# Patient Record
Sex: Female | Born: 1935 | Race: White | Hispanic: No | Marital: Married | Smoking: Never smoker
Health system: Southern US, Community
[De-identification: ages and names within clinical notes are randomized; demographics above are authoritative.]

## PROBLEM LIST (undated history)

## (undated) DIAGNOSIS — S32000A Wedge compression fracture of unspecified lumbar vertebra, initial encounter for closed fracture: Secondary | ICD-10-CM

## (undated) DIAGNOSIS — E039 Hypothyroidism, unspecified: Secondary | ICD-10-CM

## (undated) DIAGNOSIS — F03A0 Unspecified dementia, mild, without behavioral disturbance, psychotic disturbance, mood disturbance, and anxiety: Secondary | ICD-10-CM

## (undated) DIAGNOSIS — G4730 Sleep apnea, unspecified: Secondary | ICD-10-CM

## (undated) DIAGNOSIS — F0390 Unspecified dementia without behavioral disturbance: Secondary | ICD-10-CM

## (undated) DIAGNOSIS — I10 Essential (primary) hypertension: Secondary | ICD-10-CM

## (undated) DIAGNOSIS — I272 Pulmonary hypertension, unspecified: Secondary | ICD-10-CM

## (undated) HISTORY — DX: Hypothyroidism, unspecified: E03.9

## (undated) HISTORY — DX: Pulmonary hypertension, unspecified: I27.20

## (undated) HISTORY — PX: KYPHOSIS SURGERY: SHX114

## (undated) HISTORY — DX: Essential (primary) hypertension: I10

## (undated) HISTORY — DX: Sleep apnea, unspecified: G47.30

## (undated) HISTORY — PX: ANKLE FRACTURE SURGERY: SHX122

## (undated) HISTORY — DX: Wedge compression fracture of unspecified lumbar vertebra, initial encounter for closed fracture: S32.000A

---

## 2004-10-15 ENCOUNTER — Ambulatory Visit: Payer: Self-pay

## 2004-10-22 ENCOUNTER — Inpatient Hospital Stay: Payer: Self-pay | Admitting: Unknown Physician Specialty

## 2006-02-02 ENCOUNTER — Other Ambulatory Visit: Payer: Self-pay

## 2006-02-02 ENCOUNTER — Inpatient Hospital Stay: Payer: Self-pay | Admitting: Internal Medicine

## 2006-04-06 ENCOUNTER — Ambulatory Visit: Payer: Self-pay | Admitting: Gastroenterology

## 2006-05-18 ENCOUNTER — Ambulatory Visit: Payer: Self-pay | Admitting: Gastroenterology

## 2008-06-02 ENCOUNTER — Ambulatory Visit: Payer: Self-pay | Admitting: Internal Medicine

## 2008-06-15 ENCOUNTER — Ambulatory Visit: Payer: Self-pay | Admitting: Internal Medicine

## 2010-03-05 ENCOUNTER — Ambulatory Visit: Payer: Self-pay | Admitting: Cardiology

## 2010-03-12 ENCOUNTER — Ambulatory Visit: Payer: Self-pay | Admitting: Cardiology

## 2010-07-05 ENCOUNTER — Emergency Department: Payer: Self-pay | Admitting: Emergency Medicine

## 2010-10-21 ENCOUNTER — Ambulatory Visit: Payer: Self-pay

## 2010-10-23 ENCOUNTER — Inpatient Hospital Stay: Payer: Self-pay | Admitting: Unknown Physician Specialty

## 2010-10-27 ENCOUNTER — Encounter: Payer: Self-pay | Admitting: Internal Medicine

## 2010-11-05 ENCOUNTER — Ambulatory Visit: Payer: Self-pay | Admitting: Internal Medicine

## 2010-11-06 ENCOUNTER — Encounter: Payer: Self-pay | Admitting: Internal Medicine

## 2011-05-07 ENCOUNTER — Observation Stay: Payer: Self-pay | Admitting: Internal Medicine

## 2011-09-05 ENCOUNTER — Ambulatory Visit (INDEPENDENT_AMBULATORY_CARE_PROVIDER_SITE_OTHER): Payer: Medicare Other | Admitting: Critical Care Medicine

## 2011-09-05 ENCOUNTER — Encounter: Payer: Self-pay | Admitting: Critical Care Medicine

## 2011-09-05 DIAGNOSIS — S32009A Unspecified fracture of unspecified lumbar vertebra, initial encounter for closed fracture: Secondary | ICD-10-CM

## 2011-09-05 DIAGNOSIS — G4730 Sleep apnea, unspecified: Secondary | ICD-10-CM

## 2011-09-05 DIAGNOSIS — J449 Chronic obstructive pulmonary disease, unspecified: Secondary | ICD-10-CM

## 2011-09-05 DIAGNOSIS — M810 Age-related osteoporosis without current pathological fracture: Secondary | ICD-10-CM | POA: Insufficient documentation

## 2011-09-05 DIAGNOSIS — R0602 Shortness of breath: Secondary | ICD-10-CM

## 2011-09-05 DIAGNOSIS — I10 Essential (primary) hypertension: Secondary | ICD-10-CM | POA: Insufficient documentation

## 2011-09-05 DIAGNOSIS — E039 Hypothyroidism, unspecified: Secondary | ICD-10-CM

## 2011-09-05 DIAGNOSIS — S32000A Wedge compression fracture of unspecified lumbar vertebra, initial encounter for closed fracture: Secondary | ICD-10-CM

## 2011-09-05 MED ORDER — TIOTROPIUM BROMIDE MONOHYDRATE 18 MCG IN CAPS: 18.0000 ug | ORAL_CAPSULE | Freq: Every day | RESPIRATORY_TRACT | Status: DC

## 2011-09-05 MED ORDER — ALBUTEROL SULFATE (2.5 MG/3ML) 0.083% IN NEBU: 2.5000 mg | INHALATION_SOLUTION | Freq: Four times a day (QID) | RESPIRATORY_TRACT | Status: DC | PRN

## 2011-09-05 MED ORDER — LEVALBUTEROL HCL 0.63 MG/3ML IN NEBU: 1.0000 | INHALATION_SOLUTION | Freq: Four times a day (QID) | RESPIRATORY_TRACT | Status: DC | PRN

## 2011-09-05 NOTE — Patient Instructions (Addendum)
Stop Peforomist  on schedule, and  only use as needed  Albuterol 4 times daily for shortness of breath>>new script sent to pharmacy Start Spriva daily I will review records from [place] and review your CT Scan and call you Next OV will be in our [place] office with Dr Heber  in one month

## 2011-09-05 NOTE — Progress Notes (Signed)
Subjective:    Patient ID: Shelby George, female    DOB: June 21, 1916, 77 y.o.   MRN: [id number]  HPI Comments: Dx Copd 2011.   Had a pulmonary MD at [facility].   Shortness of Breath The current episode started more than 1 year ago. The problem occurs daily (exertional only, up to dining room). The problem has been gradually worsening. Pertinent negatives include no abdominal pain, chest pain, claudication, coryza, ear pain, fever, headaches, hemoptysis, leg pain, leg swelling, neck pain, orthopnea, PND, rash, rhinorrhea, sore throat, sputum production, swollen glands, syncope, vomiting or wheezing. Risk factors include smoking (worked in an office with smokers , other wise the pt is a lifelong never smoker). She has tried beta agonist inhalers for the symptoms. The treatment provided mild relief. Her past medical history is significant for COPD. There is no history of asthma, bronchiolitis, CAD, DVT, a heart failure, PE or pneumonia.    Past Medical History  Diagnosis Date  . Hypothyroidism   . Lumbar compression fracture   . Osteoporosis   . Pulmonary hypertension   . Systemic primary arterial hypertension   . Sleep apnea      Family History  Problem Relation Age of Onset  . Cerebral aneurysm Father      History   Social History  . Marital Status: Widowed    Spouse Name: N/A    Number of Children: 4  . Years of Education: N/A   Occupational History  . 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 Smoking status: Never Smoker   . Smokeless tobacco: Not on file  . Alcohol Use: No  . Drug Use: No  . Sexually Active: Not on file   Other Topics Concern  . Not on file   Social History Narrative  . No narrative on file     Allergies  Allergen Reactions  . Celebrex (Celecoxib)   . Fosamax      No outpatient prescriptions prior to visit.     Review of Systems  Constitutional: Negative for fever, chills, diaphoresis, activity  change, appetite change, fatigue and unexpected weight change.  HENT: Positive for hearing loss. Negative for ear pain, nosebleeds, congestion, sore throat, facial swelling, rhinorrhea, sneezing, mouth sores, trouble swallowing, neck pain, neck stiffness, dental problem, voice change, postnasal drip, sinus pressure, tinnitus and ear discharge.   Eyes: Negative for photophobia, discharge, itching and visual disturbance.  Respiratory: Positive for shortness of breath. Negative for apnea, cough, hemoptysis, sputum production, choking, chest tightness, wheezing and stridor.   Cardiovascular: Negative for chest pain, palpitations, orthopnea, claudication, leg swelling, syncope and PND.  Gastrointestinal: Negative for nausea, vomiting, abdominal pain, constipation, blood in stool and abdominal distention.  Genitourinary: Negative for dysuria, urgency, frequency, hematuria, flank pain, decreased urine volume and difficulty urinating.  Musculoskeletal: Positive for back pain. Negative for myalgias, joint swelling, arthralgias and gait problem.  Skin: Negative for color change, pallor and rash.  Neurological: Negative for dizziness, tremors, seizures, syncope, speech difficulty, weakness, light-headedness, numbness and headaches.  Hematological: Negative for adenopathy. Does not bruise/bleed easily.  Psychiatric/Behavioral: Negative for confusion, sleep disturbance and agitation. The patient is not nervous/anxious.        Objective:   Physical Exam  Filed Vitals:   09/05/11 0935  BP: 130/70  Pulse: 53  Temp: 98.1 F (36.7 C)  TempSrc: Oral  Height: 4\' 11"  (1.499 m)  Weight: 74.027 kg (163 lb 3.2 oz)  SpO2: 96%  Gen: Pleasant, well-nourished, in no distress,  normal affect  ENT: No lesions,  mouth clear,  oropharynx clear, no postnasal drip  Neck: No JVD, no TMG, no carotid bruits  Lungs: No use of accessory muscles, no dullness to percussion,distant BS  Cardiovascular: RRR, heart  sounds normal, no murmur or gallops, no peripheral edema  Abdomen: soft and NT, no HSM,  BS normal  Musculoskeletal: No deformities, no cyanosis or clubbing  Neuro: alert, non focal  Skin: Warm, no lesions or rashes  Cleda Daub 11/30: severe peripheral airflow obstruction CT Chest by report: emphysematous changes      Assessment & Plan:   COPD (chronic obstructive pulmonary disease) COPD d/t to passive smoke exposure. Ineffective treatment with perforomist Plan Trial Spiriva D/c perforomist Obtain outside recordes    Updated Medication List Outpatient Encounter Prescriptions as of 09/05/2011  Medication Sig Dispense Refill  . acetaminophen (TYLENOL) 325 MG tablet Take 650 mg by mouth every 6 (six) hours as needed.        Marland Kitchen amlodipine-benazepril (LOTREL) 2.5-10 MG per capsule Take 1 capsule by mouth daily.        . calcium gluconate 500 MG tablet Take 500 mg by mouth 2 (two) times daily.        . isosorbide mononitrate (IMDUR) 60 MG 24 hr tablet Take 90 mg by mouth daily.        Marland Kitchen levothyroxine (SYNTHROID, LEVOTHROID) 112 MCG tablet Take 112 mcg by mouth daily.        . metoprolol succinate (TOPROL-XL) 25 MG 24 hr tablet Take 25 mg by mouth daily.        Marland Kitchen DISCONTD: formoterol (PERFOROMIST) 20 MCG/2ML nebulizer solution Take 20 mcg by nebulization 2 (two) times daily.        Marland Kitchen DISCONTD: levalbuterol (XOPENEX) 0.63 MG/3ML nebulizer solution Take 1 ampule by nebulization 2 (two) times daily.       Marland Kitchen DISCONTD: levalbuterol (XOPENEX) 0.63 MG/3ML nebulizer solution Take 3 mLs (0.63 mg total) by nebulization every 6 (six) hours as needed for wheezing.  3 mL    . albuterol (PROVENTIL) (2.5 MG/3ML) 0.083% nebulizer solution Take 3 mLs (2.5 mg total) by nebulization every 6 (six) hours as needed for wheezing or shortness of breath.  120 mL  6  . tiotropium (SPIRIVA HANDIHALER) 18 MCG inhalation capsule Place 1 capsule (18 mcg total) into inhaler and inhale daily.  30 capsule  6  . DISCONTD:  aspirin 81 MG tablet Take 81 mg by mouth daily.

## 2011-09-07 DIAGNOSIS — J449 Chronic obstructive pulmonary disease, unspecified: Secondary | ICD-10-CM | POA: Insufficient documentation

## 2011-09-07 NOTE — Assessment & Plan Note (Signed)
COPD d/t to passive smoke exposure. Ineffective treatment with perforomist Plan Trial Spiriva D/c perforomist Obtain outside recordes

## 2011-09-28 ENCOUNTER — Emergency Department: Payer: Self-pay | Admitting: Unknown Physician Specialty

## 2011-10-17 ENCOUNTER — Ambulatory Visit: Payer: Medicare Other | Admitting: Internal Medicine

## 2011-11-28 ENCOUNTER — Telehealth: Payer: Self-pay | Admitting: Critical Care Medicine

## 2011-11-28 NOTE — Telephone Encounter (Signed)
Per PW last note pt was to have a 1 month follow up w/ [facility] in [place]. I have scheduled this for 12/11/11 at 3:00. Mrs. Shelby George is aware of this and the address. Nothing further was needed

## 2011-12-11 ENCOUNTER — Ambulatory Visit (INDEPENDENT_AMBULATORY_CARE_PROVIDER_SITE_OTHER): Payer: Medicare Other | Admitting: Pulmonary Disease

## 2011-12-11 DIAGNOSIS — J449 Chronic obstructive pulmonary disease, unspecified: Secondary | ICD-10-CM

## 2011-12-11 NOTE — Progress Notes (Deleted)
  Subjective:    Patient ID: Shelby George, female    DOB: 25-Jan-1916, 78 y.o.   MRN: [id number]  Synopsis: Ms. Knudtson was first seen in the [facility]monary office by Dr. Delford Field on 09/05/2011 for shortness of breath.  She was found to have obstruction which, in the setting of significant passive smoke exposure, was interpreted as COPD.  HPI  Past Medical History  Diagnosis Date  . Hypothyroidism   . Lumbar compression fracture   . Osteoporosis   . Pulmonary hypertension   . Systemic primary arterial hypertension   . Sleep apnea      Family History  Problem Relation Age of Onset  . Cerebral aneurysm Father      History   Social History  . Marital Status: Widowed    Spouse Name: N/A    Number of Children: 4  . Years of Education: N/A   Occupational History  . 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 Smoking status: Never Smoker   . Smokeless tobacco: Not on file  . Alcohol Use: No  . Drug Use: No  . Sexually Active: Not on file   Other Topics Concern  . Not on file   Social History Narrative  . No narrative on file     Allergies  Allergen Reactions  . Celebrex (Celecoxib)   . Fosamax      Outpatient Prescriptions Prior to Visit  Medication Sig Dispense Refill  . acetaminophen (TYLENOL) 325 MG tablet Take 650 mg by mouth every 6 (six) hours as needed.        Marland Kitchen albuterol (PROVENTIL) (2.5 MG/3ML) 0.083% nebulizer solution Take 3 mLs (2.5 mg total) by nebulization every 6 (six) hours as needed for wheezing or shortness of breath.  120 mL  6  . amlodipine-benazepril (LOTREL) 2.5-10 MG per capsule Take 1 capsule by mouth daily.        . calcium gluconate 500 MG tablet Take 500 mg by mouth 2 (two) times daily.        . isosorbide mononitrate (IMDUR) 60 MG 24 hr tablet Take 90 mg by mouth daily.        Marland Kitchen levothyroxine (SYNTHROID, LEVOTHROID) 112 MCG tablet Take 112 mcg by mouth daily.        . metoprolol succinate (TOPROL-XL)  25 MG 24 hr tablet Take 25 mg by mouth daily.        Marland Kitchen tiotropium (SPIRIVA HANDIHALER) 18 MCG inhalation capsule Place 1 capsule (18 mcg total) into inhaler and inhale daily.  30 capsule  6      Review of Systems     Objective:   Physical Exam   09/05/11 Simple Spirometry: Ratio 66%, FEV1 0.77 L (65% pred)  05/2011 CT chest with contrast at [facility]: no PE, linear scarring in bases; s/p vertebroplasty, breast mass  01/2011 2D TTE: LVEF 60-65%, normal LV function, normal RV size and function, trivial AR, MR, TR    Assessment & Plan:

## 2011-12-23 ENCOUNTER — Encounter: Payer: Self-pay | Admitting: Pulmonary Disease

## 2011-12-23 ENCOUNTER — Ambulatory Visit (INDEPENDENT_AMBULATORY_CARE_PROVIDER_SITE_OTHER): Payer: Medicare Other | Admitting: Pulmonary Disease

## 2011-12-23 VITALS — BP 118/60 | HR 52 | Temp 97.6°F | Ht 60.0 in | Wt 159.0 lb

## 2011-12-23 DIAGNOSIS — J449 Chronic obstructive pulmonary disease, unspecified: Secondary | ICD-10-CM

## 2011-12-23 NOTE — Patient Instructions (Signed)
Continue using the albuterol as needed. Use Spiriva every day. Join an exercise group at your facility and try to exercise 20-30 minutes a day. We will see you back in 3 months.

## 2011-12-23 NOTE — Progress Notes (Signed)
Subjective:    Patient ID: Shelby George, female    DOB: July 08, 1916, 78 y.o.   MRN: [id number]  Synopsis: Shelby George established care with the [facility] pulmonary clinic in 11/2011 with Dr. Delford Field for evaluation of shortness of breath.  He diagnosed her with COPD and placed her on spiriva and albuterol.  HPI  12/23/11 ROV -- Shelby George returns for follow up after her clinic visit with Dr. Delford Field.  She states that she is still breathless with minimal exertion such as walking from her room at the assisted living facility to the dining hall.  She denies chest pain or leg swelling aside from chronic right ankle swelling from a prior injury.  She states that when she uses albuterol it helps a lot, and she uses it 2-3 times per day.  She denies wheezing or chest tightness.  She does not exercise regularly. She only uses the Spiriva sporadically but states that it helps when she takes it.  Of note, initially in the interview she noted that it was difficult to use so she stopped, but then later she said that she felt it was easy to use.  Past Medical History  Diagnosis Date  . Hypothyroidism   . Lumbar compression fracture   . Osteoporosis   . Pulmonary hypertension   . Systemic primary arterial hypertension   . Sleep apnea      Family History  Problem Relation Age of Onset  . Cerebral aneurysm Father      History   Social History  . Marital Status: Widowed    Spouse Name: N/A    Number of Children: 4  . Years of Education: N/A   Occupational History  . 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 Smoking status: Never Smoker   . Smokeless tobacco: Never Used  . Alcohol Use: No  . Drug Use: No  . Sexually Active: Not on file   Other Topics Concern  . Not on file   Social History Narrative  . No narrative on file     Allergies  Allergen Reactions  . Celebrex (Celecoxib)   . Fosamax      Outpatient Prescriptions Prior to Visit  Medication Sig  Dispense Refill  . albuterol (PROVENTIL) (2.5 MG/3ML) 0.083% nebulizer solution Take 3 mLs (2.5 mg total) by nebulization every 6 (six) hours as needed for wheezing or shortness of breath.  120 mL  6  . amlodipine-benazepril (LOTREL) 2.5-10 MG per capsule Take 1 capsule by mouth daily.        . isosorbide mononitrate (IMDUR) 60 MG 24 hr tablet Take 90 mg by mouth daily.        Marland Kitchen levothyroxine (SYNTHROID, LEVOTHROID) 112 MCG tablet Take 112 mcg by mouth daily.        . metoprolol succinate (TOPROL-XL) 25 MG 24 hr tablet Take 25 mg by mouth daily.        Marland Kitchen acetaminophen (TYLENOL) 325 MG tablet Take 650 mg by mouth every 6 (six) hours as needed.        . calcium gluconate 500 MG tablet Take 500 mg by mouth 2 (two) times daily.        Marland Kitchen tiotropium (SPIRIVA HANDIHALER) 18 MCG inhalation capsule Place 1 capsule (18 mcg total) into inhaler and inhale daily.  30 capsule  6      Review of Systems  Constitutional: Negative for fever, chills and unexpected weight change.  HENT: Negative for  ear pain, nosebleeds, congestion, sore throat, rhinorrhea, sneezing, trouble swallowing, dental problem, voice change, postnasal drip and sinus pressure.   Eyes: Negative for visual disturbance.  Respiratory: Positive for shortness of breath. Negative for cough and choking.   Cardiovascular: Negative for chest pain and leg swelling.  Gastrointestinal: Negative for vomiting, abdominal pain and diarrhea.  Genitourinary: Negative for difficulty urinating.  Musculoskeletal: Negative for arthralgias.  Skin: Negative for rash.  Neurological: Negative for tremors, syncope and headaches.  Hematological: Does not bruise/bleed easily.       Objective:   Physical Exam  Filed Vitals:   12/23/11 1532  BP: 118/60  Pulse: 52  Temp: 97.6 F (36.4 C)  TempSrc: Oral  Height: 5' (1.524 m)  Weight: 159 lb (72.122 kg)  SpO2: 93%   Gen: well appearing, no acute distress HEENT: NCAT, PERRL, EOMi, OP clear, neck supple  without masses PULM: Few insp crackles in R base, otherwise clear CV: RRR, there is a systolic murmur in the RUSB, no JVD AB: BS+, soft, nontender, no hsm Ext: warm, trace R ankle edema, no edema in left foot, no clubbing, no cyanosis Derm: no rash or skin breakdown Neuro: A&Ox4, CN II-XII intact, strength 5/5 in all 4 extremities  09/05/11 Simple spirometry: FEV1/FVC 65% FEV1 0.77L  (65% pred)    Assessment & Plan:   COPD (chronic obstructive pulmonary disease) I explained to Shelby George that her breathlessness is likely due to COPD given her passive smoke exposure in the past.  However, it is normal for adults her age to have some degree of obstruction, so I think it is fair to say that severe lung obstruction is not the sole reason for her breathlessness.  She says that the Spiriva helps, but strangely she is not using it daily.    She is also likely quite deconditioned which also contributes to her dyspnea.  We discussed both aggressive and more nihilistic approaches to her symptoms and decided on the following.  Plan: -Use spiriva daily -join the exercise club ("silver sneakers") at the gym at her ALF with plans to exercise 20-30 minutes daily -continue taking the albuterol as written    Updated Medication List Outpatient Encounter Prescriptions as of 12/23/2011  Medication Sig Dispense Refill  . albuterol (PROVENTIL) (2.5 MG/3ML) 0.083% nebulizer solution Take 3 mLs (2.5 mg total) by nebulization every 6 (six) hours as needed for wheezing or shortness of breath.  120 mL  6  . amlodipine-benazepril (LOTREL) 2.5-10 MG per capsule Take 1 capsule by mouth daily.        . Cholecalciferol (VITAMIN D) 2000 UNITS CAPS Take 1 capsule by mouth daily.      . isosorbide mononitrate (IMDUR) 60 MG 24 hr tablet Take 90 mg by mouth daily.        Marland Kitchen levothyroxine (SYNTHROID, LEVOTHROID) 112 MCG tablet Take 112 mcg by mouth daily.        . metoprolol succinate (TOPROL-XL) 25 MG 24 hr tablet Take 25  mg by mouth daily.        Marland Kitchen tiotropium (SPIRIVA) 18 MCG inhalation capsule Place 18 mcg into inhaler and inhale daily. As needed per pt      . DISCONTD: acetaminophen (TYLENOL) 325 MG tablet Take 650 mg by mouth every 6 (six) hours as needed.        Marland Kitchen DISCONTD: calcium gluconate 500 MG tablet Take 500 mg by mouth 2 (two) times daily.        Marland Kitchen DISCONTD: tiotropium (SPIRIVA  HANDIHALER) 18 MCG inhalation capsule Place 1 capsule (18 mcg total) into inhaler and inhale daily.  30 capsule  6

## 2011-12-23 NOTE — Assessment & Plan Note (Signed)
I explained to Shelby George that her breathlessness is likely due to COPD given her passive smoke exposure in the past.  However, it is normal for adults her age to have some degree of obstruction, so I think it is fair to say that severe lung obstruction is not the sole reason for her breathlessness.  She says that the Spiriva helps, but strangely she is not using it daily.    She is also likely quite deconditioned which also contributes to her dyspnea.  We discussed both aggressive and more nihilistic approaches to her symptoms and decided on the following.  Plan: -Use spiriva daily -join the exercise club ("silver sneakers") at the gym at her ALF with plans to exercise 20-30 minutes daily -continue taking the albuterol as written

## 2012-01-21 NOTE — Progress Notes (Signed)
No show

## 2012-05-11 ENCOUNTER — Telehealth: Payer: Self-pay | Admitting: Pulmonary Disease

## 2012-05-11 ENCOUNTER — Encounter: Payer: Self-pay | Admitting: Pulmonary Disease

## 2012-05-11 ENCOUNTER — Ambulatory Visit (INDEPENDENT_AMBULATORY_CARE_PROVIDER_SITE_OTHER): Payer: Medicare Other | Admitting: Pulmonary Disease

## 2012-05-11 VITALS — BP 94/62 | HR 65 | Temp 98.2°F | Ht 60.0 in | Wt 157.0 lb

## 2012-05-11 DIAGNOSIS — J449 Chronic obstructive pulmonary disease, unspecified: Secondary | ICD-10-CM

## 2012-05-11 NOTE — Progress Notes (Signed)
Subjective:    Patient ID: Shelby George, female    DOB: 11-26-1915, 78 y.o.   MRN: [id number]  Synopsis: Shelby George established care with the [facility] pulmonary clinic in 11/2011 with Dr. Delford Field for evaluation of shortness of breath.  He diagnosed her with COPD and placed her on spiriva and albuterol.  HPI   12/23/11 ROV -- Shelby George returns for follow up after her clinic visit with Dr. Delford Field.  She states that she is still breathless with minimal exertion such as walking from her room at the assisted living facility to the dining hall.  She denies chest pain or leg swelling aside from chronic right ankle swelling from a prior injury.  She states that when she uses albuterol it helps a lot, and she uses it 2-3 times per day.  She denies wheezing or chest tightness.  She does not exercise regularly. She only uses the Spiriva sporadically but states that it helps when she takes it.  Of note, initially in the interview she noted that it was difficult to use so she stopped, but then later she said that she felt it was easy to use.  05/11/12 ROV --  Shelby George returns today for followup. It's pretty clear that she has not been using her Spiriva as directed. She thinks that she's supposed to be using it on an as-needed basis and is frustrated that it does not provide immediate relief and her shortness of breath. I had a very difficult time trying to ascertain as to whether or not she she is on daily basis consistently for the last month. Given the fact that she's only refilled the inhaler once since March I think it's unlikely. She states that she continues to get immediate benefit from using albuterol on a nearly every 6 hour basis. She reports no cough no chest pain no leg swelling. She is not participating in the Silver sneakers program at her assisted living facility because she doesn't want to.   Past Medical History  Diagnosis Date  . Hypothyroidism   . Lumbar compression fracture   . Osteoporosis    . Pulmonary hypertension   . Systemic primary arterial hypertension   . Sleep apnea        Review of Systems  Constitutional: Negative for fever, chills and unexpected weight change.  HENT: Negative for nosebleeds, congestion, rhinorrhea and postnasal drip.   Eyes: Negative for visual disturbance.  Respiratory: Positive for shortness of breath. Negative for cough and choking.   Cardiovascular: Negative for chest pain and leg swelling.       Objective:   Physical Exam   Filed Vitals:   05/11/12 1438  BP: 94/62  Pulse: 65  Temp: 98.2 F (36.8 C)  TempSrc: Oral  Height: 5' (1.524 m)  Weight: 157 lb (71.215 kg)  SpO2: 97%   Gen: well appearing, no acute distress HEENT: NCAT, PERRL, EOMi, OP clear, neck supple without masses PULM: Few basilar crackles, otherwise CTA B CV: RRR, there is a systolic murmur in the RUSB, no JVD AB: BS+, soft, nontender, no hsm Ext: warm, trace R ankle edema, no edema in left foot, no clubbing, no cyanosis   09/05/11 Simple spirometry: FEV1/FVC 65% FEV1 0.77L  (65% pred)    Assessment & Plan:   COPD (chronic obstructive pulmonary disease) This is been a stable interval for Shelby George by her lack of progression of symptoms. However it is difficult to assess how well she is responding to therapy as she clearly  has not been using the Spiriva effectively. We spent the majority of the visit today talking about appropriate use of Spiriva and how it is unlikely that she will see an immediate benefit after using the medication.  Plan: -I. advised her to start using the Spiriva at 8:30 daily when she takes her other daily medications. - She was advised that she's not used Spriva as needed -Asked her to continue using albuterol regularly as she clearly benefits from this -If after one month of continuous Spriva use she has not felt improvement in her breathing then I will change her to duo nebs rather than albuterol to be used on a scheduled basis  throughout the day.    Updated Medication List Outpatient Encounter Prescriptions as of 05/11/2012  Medication Sig Dispense Refill  . albuterol (PROVENTIL) (2.5 MG/3ML) 0.083% nebulizer solution Take 3 mLs (2.5 mg total) by nebulization every 6 (six) hours as needed for wheezing or shortness of breath.  120 mL  6  . amlodipine-benazepril (LOTREL) 2.5-10 MG per capsule Take 1 capsule by mouth daily.        . Cholecalciferol (VITAMIN D) 2000 UNITS CAPS Take 1 capsule by mouth daily.      . isosorbide mononitrate (IMDUR) 60 MG 24 hr tablet Take 90 mg by mouth daily.        Marland Kitchen levothyroxine (SYNTHROID, LEVOTHROID) 112 MCG tablet Take 112 mcg by mouth daily.        . metoprolol succinate (TOPROL-XL) 25 MG 24 hr tablet Take 25 mg by mouth daily.        Marland Kitchen tiotropium (SPIRIVA) 18 MCG inhalation capsule Place 18 mcg into inhaler and inhale daily. As needed per pt

## 2012-05-11 NOTE — Patient Instructions (Signed)
Take spiriva every day at 8:30 every day no matter how you feel. Only take Spiriva once per day. Keep taking the albuterol as you are doing. If you are not breathing any better in one month, call us so we can switch your albuterol to Duoneb. We will see you back in 2-3 months.

## 2012-05-11 NOTE — Assessment & Plan Note (Addendum)
This is been a stable interval for Shelby George by her lack of progression of symptoms. However it is difficult to assess how well she is responding to therapy as she clearly has not been using the Spiriva effectively. We spent the majority of the visit today talking about appropriate use of Spiriva and how it is unlikely that she will see an immediate benefit after using the medication.  Plan: -I. advised her to start using the Spiriva at 8:30 daily when she takes her other daily medications. - She was advised that she's not used Spriva as needed -Asked her to continue using albuterol regularly as she clearly benefits from this -If after one month of continuous Spriva use she has not felt improvement in her breathing then I will change her to duo nebs rather than albuterol to be used on a scheduled basis throughout the day.

## 2012-05-11 NOTE — Telephone Encounter (Signed)
This was not on pt medlist. I have added this to med list--lmomtcb x1 to make aware of this

## 2012-05-12 NOTE — Telephone Encounter (Signed)
Daughter aware.  Nothing further needed.  [facility]

## 2012-05-12 NOTE — Telephone Encounter (Signed)
lmomtcb x 2  

## 2012-06-09 ENCOUNTER — Telehealth: Payer: Self-pay | Admitting: Pulmonary Disease

## 2012-06-09 NOTE — Telephone Encounter (Signed)
Called, spoke with pt's daughter.  She was last seen on 05/11/12:  Patient Instructions     Take spiriva every day at 8:30 every day no matter how you feel.  Only take Spiriva once per day.  Keep taking the albuterol as you are doing.  If you are not breathing any better in one month, call us so we can switch your albuterol to Duoneb.  We will see you back in 2-3 months.    ----  Reports pt is using spiriva everyday.  She is no better - daughter reports she is having a few more episodes of SOB since last visit.  She is using albuterol neb approx twice daily.  Has o2 to use qhs -- pt has even used this a few times during the day as she felt she needed more o2 and had no relief from neb.  Requesting further recs.  Dr. Kendrick Fries, pls advise.  Thank you.

## 2012-06-10 MED ORDER — IPRATROPIUM-ALBUTEROL 0.5-2.5 (3) MG/3ML IN SOLN: RESPIRATORY_TRACT | Status: DC

## 2012-06-10 NOTE — Telephone Encounter (Signed)
D/C spiriva and albuterol Start duoneb qid Thanks

## 2012-06-10 NOTE — Telephone Encounter (Signed)
I spoke with daughter and is aware pt is to d/c spiriva and albuterol. Will start her on duoneb QID. She asked this be sent to [facility] pharmacy. I have done so. Nothing further was needed

## 2012-06-15 ENCOUNTER — Telehealth: Payer: Self-pay | Admitting: Pulmonary Disease

## 2012-06-15 NOTE — Telephone Encounter (Signed)
Daughter calling - Short of breath - just changed to duonebs. Having difficulty remembering to take it 4 /day. Took an extra one & felt better

## 2012-06-15 NOTE — Telephone Encounter (Signed)
noted 

## 2012-11-22 ENCOUNTER — Telehealth: Payer: Self-pay | Admitting: Pulmonary Disease

## 2012-11-22 NOTE — Telephone Encounter (Signed)
?   Has she ever had Alpha 1 testing that we don't know that about? LMTCB for Fannie Knee

## 2012-11-23 ENCOUNTER — Encounter: Payer: Self-pay | Admitting: Pulmonary Disease

## 2012-11-23 ENCOUNTER — Ambulatory Visit (INDEPENDENT_AMBULATORY_CARE_PROVIDER_SITE_OTHER): Payer: Medicare Other | Admitting: Pulmonary Disease

## 2012-11-23 VITALS — BP 124/82 | HR 56 | Ht 60.0 in | Wt 157.0 lb

## 2012-11-23 DIAGNOSIS — J449 Chronic obstructive pulmonary disease, unspecified: Secondary | ICD-10-CM

## 2012-11-23 MED ORDER — IPRATROPIUM-ALBUTEROL 0.5-2.5 (3) MG/3ML IN SOLN: RESPIRATORY_TRACT | Status: DC

## 2012-11-23 NOTE — Progress Notes (Signed)
Subjective:    Patient ID: Shelby George, female    DOB: 08/25/1916, 79 y.o.   MRN: [id number]  Synopsis: Shelby George established care with the [facility] pulmonary clinic in 11/2011 with Dr. Delford Field for evaluation of shortness of breath.  He diagnosed her with COPD and placed her on spiriva and albuterol.  HPI   12/23/11 ROV -- Shelby George returns for follow up after her clinic visit with Dr. Delford Field.  She states that she is still breathless with minimal exertion such as walking from her room at the assisted living facility to the dining hall.  She denies chest pain or leg swelling aside from chronic right ankle swelling from a prior injury.  She states that when she uses albuterol it helps a lot, and she uses it 2-3 times per day.  She denies wheezing or chest tightness.  She does not exercise regularly. She only uses the Spiriva sporadically but states that it helps when she takes it.  Of note, initially in the interview she noted that it was difficult to use so she stopped, but then later she said that she felt it was easy to use.  05/11/12 ROV --  Shelby George returns today for followup. It's pretty clear that she has not been using her Spiriva as directed. She thinks that she's supposed to be using it on an as-needed basis and is frustrated that it does not provide immediate relief and her shortness of breath. I had a very difficult time trying to ascertain as to whether or not she she is on daily basis consistently for the last month. Given the fact that she's only refilled the inhaler once since March I think it's unlikely. She states that she continues to get immediate benefit from using albuterol on a nearly every 6 hour basis. She reports no cough no chest pain no leg swelling. She is not participating in the Silver sneakers program at her assisted living facility because she doesn't want to.   11/23/2012 ROV -- Shelby George is doing well and feels like she has been breathing well.  She stopped using  the spiriva and has been using duoneb tid to qid.  She feels that she can get around without difficulty.  She reports no cough but apparently has been clearing her throat a bit.  No sinus drainage or acid reflux.      Past Medical History  Diagnosis Date  . Hypothyroidism   . Lumbar compression fracture   . Osteoporosis   . Pulmonary hypertension   . Systemic primary arterial hypertension   . Sleep apnea        Review of Systems  Constitutional: Negative for fever, chills and unexpected weight change.  HENT: Negative for nosebleeds, congestion, rhinorrhea and postnasal drip.   Eyes: Negative for visual disturbance.  Respiratory: Negative for cough, choking and shortness of breath.   Cardiovascular: Negative for chest pain and leg swelling.       Objective:   Physical Exam   Filed Vitals:   11/23/12 1215  BP: 124/82  Pulse: 56  Height: 5' (1.524 m)  Weight: 157 lb (71.215 kg)  SpO2: 97%   Gen: well appearing, no acute distress HEENT: NCAT, EOMi, OP clear,  PULM: CTA B CV: RRR, there is a systolic murmur in the RUSB, no JVD AB: soft, nontender, no hsm Ext: warm, trace R ankle edema, no edema in left foot, no clubbing, no cyanosis   09/05/11 Simple spirometry: FEV1/FVC 65% FEV1 0.77L  (65%  pred)    Assessment & Plan:   COPD (chronic obstructive pulmonary disease) This has been a stable interval for Shelby George and she seems to be doing well overall.  She continues to use and benefit from the duoneb two-three times a day.    Plan: -continue duoneb tid to qid as she is doing    Updated Medication List Outpatient Encounter Prescriptions as of 11/23/2012  Medication Sig Dispense Refill  . albuterol (PROVENTIL) (2.5 MG/3ML) 0.083% nebulizer solution Take 3 mLs (2.5 mg total) by nebulization every 6 (six) hours as needed for wheezing or shortness of breath.  120 mL  6  . amlodipine-benazepril (LOTREL) 2.5-10 MG per capsule Take 1 capsule by mouth daily.        .  Cholecalciferol (VITAMIN D) 2000 UNITS CAPS Take 1 capsule by mouth daily.      Marland Kitchen donepezil (ARICEPT) 5 MG tablet Take 5 mg by mouth daily.      Marland Kitchen ipratropium-albuterol (DUONEB) 0.5-2.5 (3) MG/3ML SOLN 1 vial 4 times a day. Dx: 496  360 mL  3  . isosorbide mononitrate (IMDUR) 60 MG 24 hr tablet Take 90 mg by mouth daily.        Marland Kitchen levothyroxine (SYNTHROID, LEVOTHROID) 112 MCG tablet Take 112 mcg by mouth daily.        . metoprolol succinate (TOPROL-XL) 25 MG 24 hr tablet Take 25 mg by mouth daily.        Marland Kitchen tiotropium (SPIRIVA) 18 MCG inhalation capsule Place 18 mcg into inhaler and inhale daily. As needed per pt       No facility-administered encounter medications on file as of 11/23/2012.

## 2012-11-23 NOTE — Patient Instructions (Signed)
If you have more throat clearing problems and you have sinus congestion: -use chlortrimeton and an over the counter decongestant like pseudophed and phenylephrine -if you have heart burn with this try an over the counter decongestant  Use the nebulizer at home as you are doing  We will see you back in 6 months

## 2012-11-23 NOTE — Telephone Encounter (Signed)
Pt states that she doesn't need a call back. She is in the office and will speak to Dr. Kendrick Fries about her concerns.

## 2012-11-23 NOTE — Telephone Encounter (Signed)
lmomtcb for sue

## 2012-11-23 NOTE — Assessment & Plan Note (Signed)
This has been a stable interval for Shelby George and she seems to be doing well overall.  She continues to use and benefit from the duoneb two-three times a day.    Plan: -continue duoneb tid to qid as she is doing

## 2012-11-30 ENCOUNTER — Ambulatory Visit: Payer: Medicare Other | Admitting: Pulmonary Disease

## 2013-01-23 ENCOUNTER — Telehealth: Payer: Self-pay | Admitting: Internal Medicine

## 2013-01-23 NOTE — Telephone Encounter (Signed)
Patient just returned from [place] back to [facility] She forgot and left the electrical charge of the CPAP machine behind. Daughter is reporting chronic stable respiratory symptoms. However, patient and daughter are extremely worried that she will have a respiratory decompensation without CPAP tonight the 20th April 2014 Sunday at 8 PM where all establishments to get an Passenger transport manager are closed. They tried calling the home care company but there is no on-call and was referred to 911.  Chart review shows that no pulmonary or pulmonologist as documented patient uses CPAP machine at night so I am presuming this was sleep apnea prescribed by a sleep specialist in [place] several years ago  PLAN  - Advised daughter to stay with patient overnight  - Advised daughter to try [employer] for Passenger transport manager  - In the absence of an Passenger transport manager.adaptor monitor clinically at the bedside and if that is decompensation go to the emergency room. Explained that the likelihood of this happening is low  - On 01/24/2013 Monday daughter is to call home care company  - Sending this message to triage so that they can followup -   Dr. Kalman Shan, M.D., [facility].C.P Pulmonary and Critical Care Medicine Staff Physician [facility] Pager: [phone], If no answer or between  15:00h - 7:00h: call [phone]  01/23/2013 8:08 PM

## 2013-01-24 NOTE — Telephone Encounter (Signed)
Called and spoke with Shelby George She states that she borrowed a Consulting civil engineer from a friend who uses CPAP machine She will call the DME today and get a new one She states that the pt is doing fine and nothing needed \

## 2013-12-06 ENCOUNTER — Ambulatory Visit: Payer: Medicare PPO | Admitting: Pulmonary Disease

## 2014-11-20 ENCOUNTER — Inpatient Hospital Stay: Payer: Self-pay | Admitting: Internal Medicine

## 2015-02-04 NOTE — Consult Note (Signed)
PATIENT NAME:  Shelby George, [name]:  [id number] DATE OF BIRTH:  19-Dec-1915  DATE OF CONSULTATION:  11/20/2014  REFERRING PHYSICIAN:   CONSULTING PHYSICIAN:  Kathreen DevoidKevin L. Caretha Rumbaugh, MD  REASON FOR CONSULTATION: Left intertrochanteric hip fracture.   HISTORY OF PRESENT ILLNESS: Ms. Shelby George is a 81 year old female, who sustained a mechanical fall at her nursing facility earlier this morning. She was brought to [facility] where she was diagnosed with a displaced left intertrochanteric hip fracture by x-ray. She was admitted to the hospitalist service for preoperative clearance, and orthopedics was consulted for management of her hip fracture.   At the bedside today, the patient is a pleasant female in no acute distress. She, however, does not recall the mechanism of the fall or the situation surrounding her fall.   PAST MEDICAL HISTORY: Includes osteoporosis, coronary artery disease, hypertension, hypothyroidism, shortness of breath.  PAST SURGICAL HISTORY: Includes a previous kyphoplasty and right ankle surgery.   HOME MEDICATIONS: Symbicort 80 mcg/4.5 mcg aerosol 1 puff inhaled q.12 h., metoprolol succinate 25 mg 1 tablet daily, Levoxyl 112 mcg daily, Imdur 60 mg 1-1/2 tablets p.o. daily, amlodipine/benazepril 2.5/10 mg tablet once daily.   ALLERGIES: No known drug allergies.   SOCIAL HISTORY: The patient lives in a nursing home. She is ambulatory at baseline with a walker according to her daughter.   PHYSICAL EXAMINATION: LEFT HIP: The patient's skin is intact. There is no erythema, ecchymosis or swelling. Her thigh and leg compartments are soft and compressible. She has intact sensation to light touch and palpable pedal pulses. She can flex and extend her toes and dorsiflex and plantarflex her ankle. Her left lower extremity is shortened and externally rotated.   RADIOLOGY: X-ray films of the left hip reveal a displaced intertrochanteric hip fracture. The patient  has degenerative changes of the lumbar spine. No other fractures are noted.   ASSESSMENT: Left displaced intertrochanteric hip fracture.   PLAN: The patient is ambulatory at baseline with a walker. I have spoken with her daughter on the phone and I have recommended an intramedullary fixation for her left hip. The daughter understands the risks of surgery include infection, bleeding requiring blood transfusion, nerve or blood vessel injury, malunion, nonunion, change in lower extremity rotation or leg length discrepancy of the left lower extremity. She understands that the patient may have persistent pain, she may fail to return to her ambulatory baseline status and may require further surgery. Medical risks include, but are not limited to DVT and pulmonary embolism, myocardial infarction, stroke, pneumonia, respiratory failure and death. The patient's daughter understood these risks and agreed with the plan for surgery. Phone consent was taken from the daughter. This was witnessed by Alcario DroughtErica and [place], nurses on 1A. The patient is n.p.o. I have reviewed all her laboratory work in preparation for this surgery. She was cleared for surgery by the hospitalist service, but is deemed a moderate risk for this non-thoracic surgery.     ____________________________ Kathreen DevoidKevin L. Jaun Galluzzo, MD klk:JT D: 11/20/2014 09:04:19 ET T: 11/20/2014 09:24:19 ET JOB#: [id number]  cc: Kathreen DevoidKevin L. Haniyah Maciolek, MD, <Dictator> Kathreen DevoidKEVIN L Cherrise Occhipinti MD ELECTRONICALLY SIGNED 11/29/2014 14:45

## 2015-02-04 NOTE — H&P (Signed)
PATIENT NAME:  Shelby George, Shelby George MR#:  [id number] DATE OF BIRTH:  11/27/1915  DATE OF ADMISSION:  11/20/2014  REFERRING PHYSICIAN: [place] SinkJade J. Dolores FrameSung, M.D.   PRIMARY CARE PHYSICIAN: Danella PentonMark F. Miller, M.D.    ADMIT DIAGNOSIS: Left intertrochanteric fracture.   HISTORY OF PRESENT ILLNESS: This is a 81 year old, Caucasian female, who presents to the Emergency Department with a painful left hip. X-ray of the extremity showed an intertrochanteric fracture of the left femur. The patient states that she thinks she fell as she was going to the bathroom, but cannot recall how long she may have been on the floor. She lives at a retirement community. She had definitely received pain medications at the time of our interview, which may contribute to some of her memory loss, but she may also have some element of dementia. Aside from her hip, the patient denies any pain or shortness of breath. Orthopedic surgery was consulted, and the Emergency Department staff called the internal medicine service for admission.   REVIEW OF SYSTEMS:   CONSTITUTIONAL: The patient denies fevers or recent illness.  EYES: Denies blurred vision or inflammation.  ENT: Denies tinnitus or sore throat.  RESPIRATORY: Denies cough or shortness of breath.  CARDIOVASCULAR: Denies chest pain or orthopnea.  GASTROINTESTINAL: Denies nausea, vomiting, diarrhea, or abdominal pain.   GENITOURINARY: Denies dysuria, increased frequency, or hesitancy of urination.  ENDOCRINE: Denies polyuria or polydipsia.  INTEGUMENTARY: Denies rashes or lesions.  HEMATOLOGIC AND LYMPHATIC: Denies easy bruising or bleeding.  MUSCULOSKELETAL: Admits to left hip pain, but denies myalgias.  NEUROLOGIC: Denies numbness in her extremities or dysarthria.  PSYCHIATRIC: Denies depression or suicidal ideation.   PAST MEDICAL HISTORY: Hypertension, COPD, coronary artery disease, and hypothyroidism.   PAST SURGICAL HISTORY: Kyphoplasty, bilateral cataract surgery, as well as a  right ankle ORIF.   SOCIAL HISTORY: [facility] She does not smoke, drink, or do any drugs.   FAMILY HISTORY: Her mother had diabetes.   MEDICATIONS:  1. Amlodipine with benazepril 2.5 mg/10 mg 1 capsule p.o. daily.  2. Imdur 60 mg extended release 1-1/2 tablets p.o. daily.  3. Levoxyl 112 mcg 1 tablet p.o. daily.  4. Metoprolol succinate 25 mg 1 tablet p.o. daily.  5. Symbicort 80 mcg/4.5 mcg per inhalation 1 inhalation every 12 hours.   ALLERGIES: No known drug allergies.   PERTINENT LABORATORY RESULTS AND RADIOGRAPHIC FINDINGS: Serum glucose is 126, BUN is 26, creatinine 0.91, serum sodium 142, potassium 3.8, chloride is 106, bicarbonate is 28, calcium is 9.3, serum albumin is 3.3, alkaline phosphatase is 111, AST is 29, ALT is 20.   Troponin is negative.   White blood cell count 7.1, hemoglobin 13.3, hematocrit 40.1, MCV is 90, platelet count is 164,000. INR is 1.   Urinalysis is negative for infection.   X-ray of the left hip shows a left intertrochanteric fracture with minimal rotation of the joint.   PHYSICAL EXAMINATION:  VITAL SIGNS: Temperature 97.6, pulse 86, respirations 19, blood pressure 139/59, pulse oximetry is 94% on room air.  GENERAL: The patient is alert. She is oriented to person and place, but not to time. She is not in any apparent distress.  HEENT: Normocephalic, atraumatic. Pupils equal, round, and reactive to light and accommodation. Extraocular movements are intact. Mucous membranes are moist.  NECK: Trachea is midline. No adenopathy. Thyroid is nonpalpable and nontender.  CHEST: Symmetric and atraumatic.  CARDIOVASCULAR: Regular rate and rhythm. Normal S1, S2. No rubs, clicks, or murmurs  appreciated.  LUNGS: Clear to auscultation bilaterally. Normal effort and excursion.  ABDOMEN: Positive bowel sounds, soft, nontender, nondistended. No hepatosplenomegaly.  GENITOURINARY: Deferred.  MUSCULOSKELETAL: The patient moves  her upper extremities with full range of motion bilaterally. Her left lower extremity is externally rotated and mildly flexed. Her right lower extremity, she is able to move her right lower extremity, but chooses not to due to the associated pain in the left hip. SKIN: Warm and dry. There are no rashes or lesions.  EXTREMITIES: No clubbing, or cyanosis. There is some trace pretibial edema.  NEUROLOGIC: Cranial nerves II through XII are grossly intact.  PSYCHIATRIC: Mood is normal. Affect is congruent. The patient has fair insight into her medical condition, but her judgment is questionable at the moment as she may be mildly delirious due to pain medication or secondary to dementia, as her memory is clearly impaired at this time.   ASSESSMENT AND PLAN: This is a 81 year old female with a left hip fracture.   1. Hip fracture, left intertrochanteric. The goal right now is pain management. Orthopedic surgery has been consulted. The patient is a moderate risk for non-thoracic surgery due to her advanced age and comorbidities. 2. Hypertension. We will continue metoprolol prior to her surgery. I switched it from long-acting form to the short-acting for now. We will hold her amlodipine and benazepril prior to surgery.  3. Chronic obstructive pulmonary disease, stable. Continue Symbicort.  4. Coronary artery disease, stable.  5. Hypothyroidism. Continue Synthroid.  6. Deep vein thrombosis prophylaxis will be heparin. 7. Gastrointestinal prophylaxis is none, as the patient is not critically ill.   CODE STATUS: The patient is a FULL CODE.   TIME SPENT ON ADMISSION ORDERS AND PATIENT CARE: Approximately 35 minutes.   ____________________________ Kelton Pillar. Sheryle Hail, MD msd:JT D: 11/20/2014 10:29:54 ET T: 11/20/2014 11:20:19 ET JOB#: [id number]  cc: Kelton Pillar. Sheryle Hail, MD, <Dictator> Kelton Pillar Brycelyn Gambino MD ELECTRONICALLY SIGNED 11/30/2014 0:40

## 2015-02-04 NOTE — Discharge Summary (Signed)
PATIENT NAME:  Shelby George, Shelby George MR#:  [id number] DATE OF BIRTH:  1916-01-04  DATE OF ADMISSION:  11/20/2014 DATE OF DISCHARGE:  11/23/2014  DISCHARGE DIAGNOSES:  1.  Left hip fracture.  2.  Hypertension.  3.  Hypothyroidism.  4.  Osteoporosis.  5.  Asthma/chronic obstructive pulmonary disease.   DISCHARGE MEDICATIONS: Symbicort 80/4.5 one puff q. 12, Imdur 90 mg daily, Tylenol 650 mg q. 4 p.r.n. pain, Lovenox 30 mg b.i.d., ferrous sulfate 325 mg b.i.d., calcium and vitamin D b.i.d., Dulcolax suppository p.r.n., metoprolol tartrate 25 mg b.i.d., Synthroid 100 mcg daily.   REASON FOR ADMISSION: A 81 year old female presents with left hip fracture. Please see H and P for HPI, past medical history, and physical exam.   HOSPITAL COURSE: The patient was admitted, underwent surgical correction of her hip fracture without apparent complication. Her hemoglobin dropped to 8.5 on discharge. Will need serial hemoglobins to follow up and may need her Lovenox reduced. Her benazepril and amlodipine were held due to mild hypotension and her other medications continued. Her Synthroid was dropped to 100 mcg because her TSH was suppressed. If she goes home, she can probably get back on her usual blood pressure medications, follow hemoglobin closely. Overall prognosis is good.    ____________________________ Danella PentonMark F. Miller, MD mfm:bm D: 11/23/2014 07:34:00 ET T: 11/23/2014 07:51:09 ET JOB#: [id number]  cc: Danella PentonMark F. Miller, MD, <Dictator> Danella PentonMARK F MILLER MD ELECTRONICALLY SIGNED 11/24/2014 8:19

## 2015-02-04 NOTE — Op Note (Signed)
PATIENT NAME:  Shelby, George MR#:  [id number] DATE OF BIRTH:  October 02, 1916  DATE OF PROCEDURE:  11/20/2014  PREOPERATIVE DIAGNOSIS: Left displaced intertrochanteric hip fracture.   POSTOPERATIVE DIAGNOSIS: Left displaced intertrochanteric hip fracture.   PROCEDURE: Intramedullary fixation of left intertrochanteric hip fracture.   ANESTHESIA: Spinal.   SURGEON: Kathreen Devoid, MD   ESTIMATED BLOOD LOSS: 50 mL.   COMPLICATIONS: None.   IMPLANTS: Biomet 11 x 180 mm, 130 degrees short Affixus nail with 110 mm lag screw and 36 mm distal interlocking screw.   INDICATIONS FOR SURGERY: The patient is a 81 year old female, who sustained a mechanical fall at home. She is ambulatory at baseline. Given the displaced nature of her fracture, I had recommended intramedullary fixation for this injury. I had reviewed the risks and benefits of surgery with the patient's family, which included infection, bleeding, nerve or blood vessel injury, malunion, nonunion, change in leg lengths, change in lower extremity rotation, persistent left hip pain and the need for further surgery. Medical risks include deep vein thrombosis and pulmonary embolism, myocardial infarction, stroke, pneumonia, respiratory failure and death. The family agreed to surgery. Consent was signed for both surgery and blood transfusions if necessary.   PROCEDURE NOTE: The patient was marked over the left hip according to the hospital's correct site of surgery protocol. She was brought to the Operating Room, where she underwent a spinal anesthetic by the anesthesia service. She was then placed supine on the fracture table. Her right leg was placed in a well leg holder in a hemi-lithotomy position. The left leg was placed in a leg holder, where traction and internal rotation was applied. The leg was prepped and draped in a sterile fashion. A timeout was performed to verify the patient's name, date of birth, medical record number, correct site  of surgery and correct procedure to be performed. It was also used to verify the patient had received antibiotics and all appropriate instruments, implants, and radiographic studies were available in the room. Once all in attendance were in agreement, the case began.   C-arm images were taken in both the AP and lateral planes to confirm reduction of the fracture. An incision was then made superior to the tip of the greater trochanter. The deep fascia was incised with a deep #10 blade. The abductor  muscle was then split bluntly in line with its fibers to allow for palpation of the tip of the greater trochanter. A threaded guide pin was then advanced through the tip of the greater trochanter into the proximal femur. Its position was confirmed on AP and lateral C-arm images. This was then overdrilled to allow for placement of an 11 x 180 mm Biomet short Affixus nail. This was placed into the proximal femur. Again, its position was confirmed on AP and lateral C-arm images. A second incision more inferior was made to for placement of the lag screw drill guide. A threaded guide pin was then placed through the drill sleeve across the fracture site and into the femoral head. Its position was confirmed on AP and lateral images. A tip apex distance of less than 25 mm was achieved. This was then measured for depth and was measured at 110 mm. This was then overdrilled with a lag screw drill and 110 mm lag screw was then inserted across the fracture site by hand. The position of the lag screw again was confirmed on AP and lateral C-arm images. Finally, a drill guide for the distal interlocking screw was  placed through the Affixus guide arm. Another stab incision was made to allow for positioning of this drill guide along the lateral cortex of the femur. A drill was used and advanced bicortically to allow for positioning of the distal interlocking screw. Its depth was measured off the drill bit and measured to be 36 mm. A 36  mm bicortical interlocking screw was then positioned by hand. The traction had already been taken off of the construct, after the lag screw had been placed, and the set screw had been tightened and loosened a quarter turn to allow for compression while weight-bearing. The Affixus insertion guide arm was then removed. Final AP and lateral images of the intramedullary construct were taken. All wounds were copiously irrigated. The deep fascia from the proximal incision was closed with an interrupted 0 Vicryl. All subcutaneous tissue for of the incisions was closed with 2-0 Vicryl and the skin was approximated with staples. A dry sterile dressing was applied. The patient was then transferred to a hospital bed and brought to the PACU in stable condition. I was scrubbed and present for the entire case, and all sharp and instrument counts were correct at the conclusion of the case. I spoke with the patient's family in the postoperative consultation room to let them know the case had gone without complication. The patient was stable in the recovery room.    ____________________________ Kathreen DevoidKevin L. Chadrick Sprinkle, MD klk:ap D: 11/29/2014 08:18:53 ET T: 11/29/2014 08:46:11 ET JOB#: [id number]  cc: Kathreen DevoidKevin L. Munachimso Rigdon, MD, <Dictator> Kathreen DevoidKEVIN L Margie Brink MD ELECTRONICALLY SIGNED 11/29/2014 14:46

## 2015-12-07 ENCOUNTER — Emergency Department: Payer: Medicare Other

## 2015-12-07 ENCOUNTER — Emergency Department
Admission: EM | Admit: 2015-12-07 | Discharge: 2015-12-07 | Disposition: A | Discharge status: Home / Self Care | Payer: Medicare Other | Attending: Emergency Medicine | Admitting: Emergency Medicine

## 2015-12-07 ENCOUNTER — Encounter: Payer: Self-pay | Admitting: Emergency Medicine

## 2015-12-07 DIAGNOSIS — G8929 Other chronic pain: Secondary | ICD-10-CM | POA: Insufficient documentation

## 2015-12-07 DIAGNOSIS — M1711 Unilateral primary osteoarthritis, right knee: Secondary | ICD-10-CM | POA: Insufficient documentation

## 2015-12-07 DIAGNOSIS — Y9389 Activity, other specified: Secondary | ICD-10-CM | POA: Insufficient documentation

## 2015-12-07 DIAGNOSIS — I10 Essential (primary) hypertension: Secondary | ICD-10-CM | POA: Insufficient documentation

## 2015-12-07 DIAGNOSIS — Y998 Other external cause status: Secondary | ICD-10-CM | POA: Diagnosis not present

## 2015-12-07 DIAGNOSIS — S8991XA Unspecified injury of right lower leg, initial encounter: Secondary | ICD-10-CM | POA: Diagnosis present

## 2015-12-07 DIAGNOSIS — Z79899 Other long term (current) drug therapy: Secondary | ICD-10-CM | POA: Diagnosis not present

## 2015-12-07 DIAGNOSIS — Y9289 Other specified places as the place of occurrence of the external cause: Secondary | ICD-10-CM | POA: Diagnosis not present

## 2015-12-07 DIAGNOSIS — W1839XA Other fall on same level, initial encounter: Secondary | ICD-10-CM | POA: Insufficient documentation

## 2015-12-07 DIAGNOSIS — S79911A Unspecified injury of right hip, initial encounter: Secondary | ICD-10-CM | POA: Diagnosis not present

## 2015-12-07 NOTE — ED Provider Notes (Addendum)
[facility] Emergency Department Provider Note  ____________________________________________   I have reviewed the triage vital signs and the nursing notes.   HISTORY  Chief Complaint Fall    HPI Shelby George is a 82 y.o. female who has a history of osteoporosis and chronic pain in the right leg. Patient does give history herself as she is quite with it however she does have memory issues and the rest is supplemented by her daughter. He is at her neurologic and mental baseline. According to patient and family, for the last months she's had various aches and pains in the right leg. Sometimes it seems to be in the hip sometimes in the knee. The patient states that it doesn't hurt at all. The daughter however states that the patient will not walk even a little bit at the house because it hurts. She does have a walker that she can sit on because there's wheels involved in that so she gets around. She does have nurses to come to the house to get heard business done. Patient about a month ago had a shot in the right hip according to family from Dr. Hyacinth MeekerMiller, and this seemed to help her pain. However, it came back and that he gave her another shot in the knee. The patient states that she does not have any pain anywhere and less since her knee sometimes. She states she can walk just fine. She denies any fever or chills. The family states that they were going to get an x-ray of her right knee right hip and the bar spine today but she tripped while taking off her gown at the x-ray facility and seemed to fall to the ground. She did not pass out and is not known that she hit her head. This was witnessed. The patient has been acting totally normally since that time. She does not complain of headache or stiff neck. He only injury from the fall was a slight scrape to the anterior pretibial region. The patient has had no numbness no weakness. She denies any change in her urinary status. She  denies any stool incontinence. She herself states that she does not feel any discomfort and thus I am must with her knee and she refuses any pain medication. Her daughter states that she cannot induce the patient take anything even Tylenol for the pain. The pain is not very well described therefore because the patient herself denies it is usually there. No further information is forthcoming from the patient. Limited history.  Past Medical History  Diagnosis Date  . Hypothyroidism   . Lumbar compression fracture (HCC)   . Osteoporosis   . Pulmonary hypertension (HCC)   . Systemic primary arterial hypertension   . Sleep apnea     Patient Active Problem List   Diagnosis Date Noted  . COPD (chronic obstructive pulmonary disease) (HCC) 09/07/2011  . Hypothyroidism   . Lumbar compression fracture (HCC)   . Osteoporosis   . Systemic primary arterial hypertension   . Sleep apnea     Past Surgical History  Procedure Laterality Date  . Kyphosis surgery    . Ankle fracture surgery      R ankle    Current Outpatient Rx  Name  Route  Sig  Dispense  Refill  . levothyroxine (SYNTHROID, LEVOTHROID) 100 MCG tablet   Oral   Take 100 mcg by mouth daily before breakfast.         . EXPIRED: albuterol (PROVENTIL) (2.5 MG/3ML) 0.083%  nebulizer solution   Nebulization   Take 3 mLs (2.5 mg total) by nebulization every 6 (six) hours as needed for wheezing or shortness of breath.   120 mL   6   . amlodipine-benazepril (LOTREL) 2.5-10 MG per capsule   Oral   Take 1 capsule by mouth daily.           . cholecalciferol (VITAMIN D) 1000 units tablet   Oral   Take 2,000 Units by mouth daily.         Marland Kitchen donepezil (ARICEPT) 5 MG tablet   Oral   Take 5 mg by mouth daily.         Marland Kitchen ipratropium-albuterol (DUONEB) 0.5-2.5 (3) MG/3ML SOLN      1 vial 4 times a day. Dx: 496   360 mL   3   . isosorbide mononitrate (IMDUR) 60 MG 24 hr tablet   Oral   Take 90 mg by mouth daily.            . metoprolol tartrate (LOPRESSOR) 25 MG tablet   Oral   Take 25 mg by mouth 2 (two) times daily.         Marland Kitchen tiotropium (SPIRIVA) 18 MCG inhalation capsule   Inhalation   Place 18 mcg into inhaler and inhale daily.            Allergies Alendronate sodium and Celebrex  Family History  Problem Relation Age of Onset  . Cerebral aneurysm Father     Social History Social History  Substance Use Topics  . Smoking status: Never Smoker   . Smokeless tobacco: Never Used  . Alcohol Use: No    Review of Systems Constitutional: No fever/chills Eyes: No visual changes. ENT: No sore throat. No stiff neck no neck pain Cardiovascular: Denies chest pain. Respiratory: Denies shortness of breath. Gastrointestinal:   no vomiting.  No diarrhea.  No constipation. Genitourinary: Negative for dysuria. Musculoskeletal: Negative lower extremity swelling Skin: Negative for rash. Neurological: Negative for headaches, focal weakness or numbness. 10-point ROS otherwise negative.  ____________________________________________   PHYSICAL EXAM:  VITAL SIGNS: ED Triage Vitals  Enc Vitals Group     BP 12/07/15 1524 153/54 mmHg     Pulse Rate 12/07/15 1524 67     Resp 12/07/15 1524 16     Temp 12/07/15 1524 97.9 F (36.6 C)     Temp Source 12/07/15 1524 Oral     SpO2 12/07/15 1524 96 %     Weight 12/07/15 1524 113 lb (51.256 kg)     Height 12/07/15 1524  (1.499 m)     Head Cir --      Peak Flow --      Pain Score 12/07/15 1525 6     Pain Loc --      Pain Edu? --      Excl. in GC? --     Constitutional: Alert and oriented. Well appearing and in no acute distress. Eyes: Conjunctivae are normal. PERRL. EOMI. Head: Atraumatic. Nose: No congestion/rhinnorhea. Mouth/Throat: Mucous membranes are moist.  Oropharynx non-erythematous. Neck: No stridor.   Nontender with no meningismus Cardiovascular: Normal rate, regular rhythm. Grossly normal heart sounds.  Good peripheral  circulation. Respiratory: Normal respiratory effort.  No retractions. Lungs CTAB. Abdominal: Soft and nontender. No distention. No guarding no rebound Back:  There is no focal tenderness or step off there is no midline tenderness there are no lesions noted. there is no CVA tenderness Musculoskeletal: There is tenderness to  palpation in the lateral aspect of the right knee with no erythema and no swelling no effusion not hot to touch. She has full painless range of motion of the knee. There is no obvious bruising or trauma to the area. There is no deformity. She has strong distal pulses. Her pelvis is stable. She has no tenderness to palpation of the right hip and I can flex and extend and abduct and abduct it with no evidence of discomfort. She may have some slight tenderness in the sciatic fossa on the right but there is no shingles and she has no neck or back pain. No joint effusions, no DVT signs strong distal pulses no edema Neurologic:  Normal speech and language. No gross focal neurologic deficits are appreciated.  Skin:  Skin is warm, dry and intact. No rash noted. Psychiatric: Mood and affect are normal. Speech and behavior are normal.  ____________________________________________   LABS (all labs ordered are listed, but only abnormal results are displayed)  Labs Reviewed - No data to display ____________________________________________  EKG   ____________________________________________  RADIOLOGY  I reviewed any imaging ordered by me or triage that were performed during my shift ____________________________________________   PROCEDURES  Procedure(s) performed: None  Critical Care performed: None  ____________________________________________   INITIAL IMPRESSION / ASSESSMENT AND PLAN / ED COURSE  Pertinent labs & imaging results that were available during my care of the patient were reviewed by me and considered in my medical decision making (see chart for  details).  She'll chronic leg pain of one Friday and another. She is 89. She herself has no pain at this time as I touch her knee in the wrong way. There is no laxity to the area or obvious trauma. As far as her chronic pain is concerned we will obtain x-rays at work to be obtained today for evaluation. This could be some degree of sciatica but she is neurologically intact with good pulses. Not appear to be any acute trauma to the area and most likely this is osteoarthritis. There is no evidence of septic joint or vascular insufficiency. Patient does not seem to sustain any significant trauma there is a very superficial abrasion to the left proximal pretibial region after her fall she denies hitting her head she has no neck pain she is completely at her baseline and there is no evidence of head trauma she is not anticoagulated and I do not think a CT scan is warranted at this time.   ----------------------------------------- 6:40 PM on 12/07/2015 -----------------------------------------  There is no evidence of acute fracture on x-ray. Patient does have no evidence of CT verifiable fracture either. He is eager to go home. She does not want any pain medication. This is been going on for over a month and she has adequate outpatient follow-up. No evidence of vascular or neurologic catastrophe at this time. She is neurovascularly intact. The patient is eager to go home and does not wish to stay for further evaluation at this time I do not see anything that I can keep her further in the hospital for. ____________________________________________   FINAL CLINICAL IMPRESSION(S) / ED DIAGNOSES  Final diagnoses:  None      This chart was dictated using voice recognition software.  Despite best efforts to proofread,  errors can occur which can change meaning.     Jeanmarie Plant, MD 12/07/15 1641  Jeanmarie Plant, MD 12/07/15 [phone]

## 2015-12-07 NOTE — ED Notes (Signed)
Pt discharged home after daughter verbalized understanding of discharge instructions; nad noted.

## 2015-12-07 NOTE — ED Notes (Signed)
Pt here with daughter, reports cortisone shot on Monday for right leg; reports shot has not improved right leg pain and ability to walk. Daughter also reports pt fell today while changing for xray earlier, sent here by dr Hyacinth Meekermiller. Pt reports pain in left leg.

## 2015-12-07 NOTE — ED Notes (Signed)
Pt here with daughter, who states that pt fell today while preparing to get xray on right leg, back, pelvis due to unrelieved pain. Xray did not occur, and Dr. Hyacinth MeekerMiller suggested she come to ED because she is now having pain in left leg. Pt has some memory issues and states that she is not hurting, although she told triage nurse that her leg "burns." There are two small abrasions to left lower leg. Pt alert with NAD noted.

## 2015-12-07 NOTE — ED Notes (Signed)
Pt returned from xray

## 2016-01-14 ENCOUNTER — Emergency Department: Payer: Medicare Other

## 2016-01-14 ENCOUNTER — Inpatient Hospital Stay
Admission: EM | Admit: 2016-01-14 | Discharge: 2016-01-18 | DRG: 481 | Disposition: A | Discharge status: Skilled Nursing Facility | Payer: Medicare Other | Attending: Internal Medicine | Admitting: Internal Medicine

## 2016-01-14 ENCOUNTER — Encounter: Payer: Self-pay | Admitting: Emergency Medicine

## 2016-01-14 DIAGNOSIS — Z66 Do not resuscitate: Secondary | ICD-10-CM | POA: Diagnosis present

## 2016-01-14 DIAGNOSIS — F0390 Unspecified dementia without behavioral disturbance: Secondary | ICD-10-CM | POA: Diagnosis present

## 2016-01-14 DIAGNOSIS — Z9181 History of falling: Secondary | ICD-10-CM

## 2016-01-14 DIAGNOSIS — J449 Chronic obstructive pulmonary disease, unspecified: Secondary | ICD-10-CM | POA: Diagnosis present

## 2016-01-14 DIAGNOSIS — Z419 Encounter for procedure for purposes other than remedying health state, unspecified: Secondary | ICD-10-CM

## 2016-01-14 DIAGNOSIS — Z79899 Other long term (current) drug therapy: Secondary | ICD-10-CM

## 2016-01-14 DIAGNOSIS — S72141A Displaced intertrochanteric fracture of right femur, initial encounter for closed fracture: Secondary | ICD-10-CM | POA: Diagnosis not present

## 2016-01-14 DIAGNOSIS — S7291XA Unspecified fracture of right femur, initial encounter for closed fracture: Secondary | ICD-10-CM

## 2016-01-14 DIAGNOSIS — M810 Age-related osteoporosis without current pathological fracture: Secondary | ICD-10-CM | POA: Diagnosis present

## 2016-01-14 DIAGNOSIS — G4733 Obstructive sleep apnea (adult) (pediatric): Secondary | ICD-10-CM | POA: Diagnosis present

## 2016-01-14 DIAGNOSIS — M79606 Pain in leg, unspecified: Secondary | ICD-10-CM

## 2016-01-14 DIAGNOSIS — E039 Hypothyroidism, unspecified: Secondary | ICD-10-CM | POA: Diagnosis present

## 2016-01-14 DIAGNOSIS — S72009A Fracture of unspecified part of neck of unspecified femur, initial encounter for closed fracture: Secondary | ICD-10-CM | POA: Diagnosis present

## 2016-01-14 DIAGNOSIS — I959 Hypotension, unspecified: Secondary | ICD-10-CM | POA: Diagnosis present

## 2016-01-14 DIAGNOSIS — I10 Essential (primary) hypertension: Secondary | ICD-10-CM | POA: Diagnosis present

## 2016-01-14 DIAGNOSIS — Z7952 Long term (current) use of systemic steroids: Secondary | ICD-10-CM

## 2016-01-14 DIAGNOSIS — D62 Acute posthemorrhagic anemia: Secondary | ICD-10-CM | POA: Diagnosis not present

## 2016-01-14 DIAGNOSIS — W19XXXA Unspecified fall, initial encounter: Secondary | ICD-10-CM | POA: Diagnosis present

## 2016-01-14 DIAGNOSIS — Y92002 Bathroom of unspecified non-institutional (private) residence single-family (private) house as the place of occurrence of the external cause: Secondary | ICD-10-CM

## 2016-01-14 HISTORY — DX: Unspecified dementia, mild, without behavioral disturbance, psychotic disturbance, mood disturbance, and anxiety: F03.A0

## 2016-01-14 HISTORY — DX: Unspecified dementia without behavioral disturbance: F03.90

## 2016-01-14 LAB — CBC
HEMATOCRIT: 37.9 % (ref 35.0–47.0)
HEMOGLOBIN: 12.7 g/dL (ref 12.0–16.0)
MCH: 31 pg (ref 26.0–34.0)
MCHC: 33.5 g/dL (ref 32.0–36.0)
MCV: 92.6 fL (ref 80.0–100.0)
Platelets: 174 10*3/uL (ref 150–440)
RBC: 4.09 MIL/uL (ref 3.80–5.20)
RDW: 15.1 % — AB (ref 11.5–14.5)
WBC: 9.1 10*3/uL (ref 3.6–11.0)

## 2016-01-14 MED ORDER — ONDANSETRON HCL 4 MG/2ML IJ SOLN
4.0000 mg | Freq: Once | INTRAMUSCULAR | Status: AC
  Administered 2016-01-14: 4 mg via INTRAVENOUS
  Filled 2016-01-14: qty 2

## 2016-01-14 MED ORDER — MORPHINE SULFATE (PF) 2 MG/ML IV SOLN
2.0000 mg | Freq: Once | INTRAVENOUS | Status: AC
Start: 2016-01-14 — End: 2016-01-14
  Administered 2016-01-14: 2 mg via INTRAVENOUS
  Filled 2016-01-14: qty 1

## 2016-01-14 NOTE — ED Provider Notes (Signed)
[facility] Emergency Department Provider Note  ____________________________________________  Time seen: 11:15AM  I have reviewed the triage vital signs and the nursing notes.   HISTORY  Chief Complaint Fall     HPI Shelby George is a 82 y.o. female presents with unwitnessed fall twitch patient cannot recall reason for the fall. Patient complaining of 10 out of 10 right hip pain is worse with movement. Patient also admits to striking the right side of her head however denies any loss of consciousness. Patient cannot recall any symptoms preceding the event namely no chest pain shortness of breath dizziness headache.    Past Medical History  Diagnosis Date  . Hypothyroidism   . Lumbar compression fracture (HCC)   . Osteoporosis   . Pulmonary hypertension (HCC)   . Systemic primary arterial hypertension   . Sleep apnea     Patient Active Problem List   Diagnosis Date Noted  . COPD (chronic obstructive pulmonary disease) (HCC) 09/07/2011  . Hypothyroidism   . Lumbar compression fracture (HCC)   . Osteoporosis   . Systemic primary arterial hypertension   . Sleep apnea     Past Surgical History  Procedure Laterality Date  . Kyphosis surgery    . Ankle fracture surgery      R ankle    Current Outpatient Rx  Name  Route  Sig  Dispense  Refill  . budesonide-formoterol (SYMBICORT) 80-4.5 MCG/ACT inhaler   Inhalation   Inhale 1 puff into the lungs 2 (two) times daily.         . cholecalciferol (VITAMIN D) 1000 units tablet   Oral   Take 2,000 Units by mouth daily.         Marland Kitchen galantamine (RAZADYNE) 8 MG tablet   Oral   Take 8 mg by mouth daily.         Marland Kitchen ipratropium-albuterol (DUONEB) 0.5-2.5 (3) MG/3ML SOLN   Nebulization   Take 3 mLs by nebulization every 6 (six) hours as needed (for wheezing/shortness of breath).         . isosorbide mononitrate (IMDUR) 60 MG 24 hr tablet   Oral   Take 60 mg by mouth daily.          Marland Kitchen  levothyroxine (SYNTHROID, LEVOTHROID) 100 MCG tablet   Oral   Take 100 mcg by mouth daily before breakfast.         . metoprolol succinate (TOPROL-XL) 25 MG 24 hr tablet   Oral   Take 25 mg by mouth daily.         . nitroGLYCERIN (NITROSTAT) 0.4 MG SL tablet   Sublingual   Place 0.4 mg under the tongue every 5 (five) minutes as needed for chest pain.         Marland Kitchen tiotropium (SPIRIVA) 18 MCG inhalation capsule   Inhalation   Place 18 mcg into inhaler and inhale daily.            Allergies Alendronate sodium and Celebrex  Family History  Problem Relation Age of Onset  . Cerebral aneurysm Father     Social History Social History  Substance Use Topics  . Smoking status: Never Smoker   . Smokeless tobacco: Never Used  . Alcohol Use: No    Review of Systems  Constitutional: Negative for fever. Eyes: Negative for visual changes. ENT: Negative for sore throat. Cardiovascular: Negative for chest pain. Respiratory: Negative for shortness of breath. Gastrointestinal: Negative for abdominal pain, vomiting and diarrhea. Genitourinary: Negative for  dysuria. Musculoskeletal: Negative for back pain. Positive for right hip pain Skin: Negative for rash. Neurological: Negative for headaches, focal weakness or numbness.   10-point ROS otherwise negative.  ____________________________________________   PHYSICAL EXAM:  VITAL SIGNS: ED Triage Vitals  Enc Vitals Group     BP --      Pulse --      Resp --      Temp --      Temp src --      SpO2 --      Weight --      Height --      Head Cir --      Peak Flow --      Pain Score --      Pain Loc --      Pain Edu? --      Excl. in GC? --      Constitutional: Alert and oriented. Well appearing and in no distress. Eyes: Conjunctivae are normal. PERRL. Normal extraocular movements. ENT   Head: Normocephalic and atraumatic.   Nose: No congestion/rhinnorhea.   Mouth/Throat: Mucous membranes are moist.    Neck: No stridor. Hematological/Lymphatic/Immunilogical: No cervical lymphadenopathy. Cardiovascular: Normal rate, regular rhythm. Normal and symmetric distal pulses are present in all extremities. No murmurs, rubs, or gallops. Respiratory: Normal respiratory effort without tachypnea nor retractions. Breath sounds are clear and equal bilaterally. No wheezes/rales/rhonchi. Gastrointestinal: Soft and nontender. No distention. There is no CVA tenderness. Genitourinary: deferred Musculoskeletal: Right leg shortening with external rotation. Tenderness to palpation right hip. Neurologic:  Normal speech and language. No gross focal neurologic deficits are appreciated. Speech is normal.  Skin:  Ecchymoses noted right temporal area. Psychiatric: Mood and affect are normal. Speech and behavior are normal. Patient exhibits appropriate insight and judgment.  ____________________________________________    LABS (pertinent positives/negatives)  Labs Reviewed  BASIC METABOLIC PANEL - Abnormal; Notable for the following:    Glucose, Bld 109 (*)    BUN 28 (*)    All other components within normal limits  CBC - Abnormal; Notable for the following:    RDW 15.1 (*)    All other components within normal limits  TROPONIN I     ____________________________________________   EKG ED ECG REPORT I, Echelon N Artrell Lawless, the attending physician, personally viewed and interpreted this ECG.   Date: 01/15/2016  EKG Time: 11:29PM  Rate: 65  Rhythm:normal sinus rhythm Axis: Normal  Intervals:Normal  ST&T Change: None    ____________________________________________    RADIOLOGY      CT Head Wo Contrast (Final result) Result time: 01/15/16 00:31:23   Final result by Rad Results In Interface (01/15/16 00:31:23)   Narrative:   CLINICAL DATA: Missed a step walking down stairs, falling to a tile floor.  EXAM: CT HEAD WITHOUT CONTRAST  TECHNIQUE: Contiguous axial images were obtained from the  base of the skull through the vertex without intravenous contrast.  COMPARISON: None.  FINDINGS: There is no intracranial hemorrhage or extra-axial fluid collection. There is moderate generalized atrophy. There is white matter hypodensity which is likely chronic and due to small vessel disease. Focally prominent hypodensity in the left frontal white matter may represent remote infarction. There probably also is remote lacunar infarction in the left pons.  Calvarium and skullbase are intact.  Orbits are intact.  IMPRESSION: Negative for acute intracranial traumatic injury. There is generalized atrophy, chronic white matter hypodensity and remote lacunar infarctions.   Electronically Signed By: Ellery Plunk M.D. On: 01/15/2016 00:31  DG Chest Port 1 View (Final result) Result time: 01/15/16 00:22:39   Final result by Rad Results In Interface (01/15/16 00:22:39)   Narrative:   CLINICAL DATA: Fell on tile floor and bathroom. History of pulmonary arterial hypertension, compression fractures.  EXAM: PORTABLE CHEST 1 VIEW  COMPARISON: Chest radiograph November 20, 2014  FINDINGS: The cardiac silhouette is mildly enlarged, unchanged. Calcified aortic knob. Increased lung volumes compatible with COPD without pleural effusion or focal consolidation. Multilevel thoracolumbar vertebral body cement augmentation. Patient's osteopenic. Healing RIGHT lateral rib fractures with callus. Old LEFT lateral rib fractures.  IMPRESSION: Stable cardiomegaly and COPD.   Electronically Signed By: Awilda Metroourtnay Bloomer M.D. On: 01/15/2016 00:22          DG HIP UNILAT WITH PELVIS 2-3 VIEWS RIGHT (Final result) Result time: 01/15/16 00:21:09   Final result by Rad Results In Interface (01/15/16 00:21:09)   Narrative:   CLINICAL DATA: Fell onto tile floor while trying to walk to the bathroom.  EXAM: DG HIP (WITH OR WITHOUT PELVIS) 2-3V  RIGHT  COMPARISON: 12/07/2015  FINDINGS: There is an acute intertrochanteric right hip fracture with varus angulation. No dislocation. No bone lesion or bony destruction. No other acute findings are evident  IMPRESSION: Intertrochanteric right hip fracture.   Electronically Signed By: Ellery Plunkaniel R Mitchell M.D. On: 01/15/2016 00:21       INITIAL IMPRESSION / ASSESSMENT AND PLAN / ED COURSE  Pertinent labs & imaging results that were available during my care of the patient were reviewed by me and considered in my medical decision making (see chart for details).  Patient discussed with Dr. Trilby DrummerManns and Dr. Anne HahnWillis for hospital admission for further evaluation and management  ____________________________________________   FINAL CLINICAL IMPRESSION(S) / ED DIAGNOSES  Final diagnoses:  Intertrochanteric fracture of right hip, closed, initial encounter [facility](HCC)      Darci Currentandolph N Jetaime Pinnix, MD 01/15/16 [phone]

## 2016-01-14 NOTE — ED Notes (Signed)
Patient transported to X-ray via stretcher 

## 2016-01-14 NOTE — ED Notes (Addendum)
[facility] pt falling on tile floor after attempting to ambulate to bathroom. Per c/o pain from right hip to right knee. Per EMS pt is recovering from a right tibia fracture. Hematoma noted above right eye, skin tear to right forearm. EMS vital signs: BP 190/90, pulse 70-80s. Pt is not taking blood thinners.  Pt reports was ambulating down  three steps and missed a step, causing her to fall. Pt alert and oriented x 4, no increased work in breathing noted. Pt denies chest pain, shortness of breath or dizziness.

## 2016-01-15 ENCOUNTER — Encounter
Admission: EM | Disposition: A | Discharge status: Skilled Nursing Facility | Payer: Self-pay | Source: Home / Self Care | Attending: Internal Medicine

## 2016-01-15 ENCOUNTER — Encounter: Payer: Self-pay | Admitting: Anesthesiology

## 2016-01-15 ENCOUNTER — Inpatient Hospital Stay: Payer: Medicare Other

## 2016-01-15 ENCOUNTER — Emergency Department: Payer: Medicare Other

## 2016-01-15 ENCOUNTER — Inpatient Hospital Stay: Payer: Medicare Other | Admitting: Certified Registered Nurse Anesthetist

## 2016-01-15 DIAGNOSIS — E039 Hypothyroidism, unspecified: Secondary | ICD-10-CM | POA: Diagnosis present

## 2016-01-15 DIAGNOSIS — Z9181 History of falling: Secondary | ICD-10-CM | POA: Diagnosis not present

## 2016-01-15 DIAGNOSIS — F0390 Unspecified dementia without behavioral disturbance: Secondary | ICD-10-CM | POA: Diagnosis present

## 2016-01-15 DIAGNOSIS — Z7952 Long term (current) use of systemic steroids: Secondary | ICD-10-CM | POA: Diagnosis not present

## 2016-01-15 DIAGNOSIS — Z66 Do not resuscitate: Secondary | ICD-10-CM | POA: Diagnosis present

## 2016-01-15 DIAGNOSIS — W19XXXA Unspecified fall, initial encounter: Secondary | ICD-10-CM | POA: Diagnosis present

## 2016-01-15 DIAGNOSIS — M810 Age-related osteoporosis without current pathological fracture: Secondary | ICD-10-CM | POA: Diagnosis present

## 2016-01-15 DIAGNOSIS — I10 Essential (primary) hypertension: Secondary | ICD-10-CM | POA: Diagnosis present

## 2016-01-15 DIAGNOSIS — S72009A Fracture of unspecified part of neck of unspecified femur, initial encounter for closed fracture: Secondary | ICD-10-CM | POA: Diagnosis present

## 2016-01-15 DIAGNOSIS — Z79899 Other long term (current) drug therapy: Secondary | ICD-10-CM | POA: Diagnosis not present

## 2016-01-15 DIAGNOSIS — S72141A Displaced intertrochanteric fracture of right femur, initial encounter for closed fracture: Secondary | ICD-10-CM | POA: Diagnosis present

## 2016-01-15 DIAGNOSIS — Y92002 Bathroom of unspecified non-institutional (private) residence single-family (private) house as the place of occurrence of the external cause: Secondary | ICD-10-CM | POA: Diagnosis not present

## 2016-01-15 DIAGNOSIS — D62 Acute posthemorrhagic anemia: Secondary | ICD-10-CM | POA: Diagnosis not present

## 2016-01-15 DIAGNOSIS — J449 Chronic obstructive pulmonary disease, unspecified: Secondary | ICD-10-CM | POA: Diagnosis present

## 2016-01-15 DIAGNOSIS — G4733 Obstructive sleep apnea (adult) (pediatric): Secondary | ICD-10-CM | POA: Diagnosis present

## 2016-01-15 DIAGNOSIS — I959 Hypotension, unspecified: Secondary | ICD-10-CM | POA: Diagnosis present

## 2016-01-15 HISTORY — PX: ORIF HIP FRACTURE: SHX2125

## 2016-01-15 LAB — BASIC METABOLIC PANEL
Anion gap: 6 (ref 5–15)
BUN: 28 mg/dL — AB (ref 6–20)
CALCIUM: 9.6 mg/dL (ref 8.9–10.3)
CHLORIDE: 103 mmol/L (ref 101–111)
CO2: 27 mmol/L (ref 22–32)
CREATININE: 0.79 mg/dL (ref 0.44–1.00)
GFR calc non Af Amer: >60 mL/min (ref >60)
Glucose, Bld: 109 mg/dL — ABNORMAL HIGH (ref 65–99)
Potassium: 4.5 mmol/L (ref 3.5–5.1)
SODIUM: 136 mmol/L (ref 135–145)

## 2016-01-15 LAB — CREATININE, SERUM
Creatinine, Ser: 0.58 mg/dL (ref 0.44–1.00)
GFR calc Af Amer: >60 mL/min (ref >60)
GFR calc non Af Amer: >60 mL/min (ref >60)

## 2016-01-15 LAB — GLUCOSE, CAPILLARY: Glucose-Capillary: 162 mg/dL — ABNORMAL HIGH (ref 65–99)

## 2016-01-15 LAB — TSH: TSH: 0.479 u[IU]/mL (ref 0.350–4.500)

## 2016-01-15 LAB — MRSA PCR SCREENING: MRSA by PCR: NEGATIVE

## 2016-01-15 LAB — CBC
HEMATOCRIT: 31.6 % — AB (ref 35.0–47.0)
Hemoglobin: 10.4 g/dL — ABNORMAL LOW (ref 12.0–16.0)
MCH: 30.9 pg (ref 26.0–34.0)
MCHC: 32.9 g/dL (ref 32.0–36.0)
MCV: 94 fL (ref 80.0–100.0)
Platelets: 142 10*3/uL — ABNORMAL LOW (ref 150–440)
RBC: 3.36 MIL/uL — ABNORMAL LOW (ref 3.80–5.20)
RDW: 15 % — AB (ref 11.5–14.5)
WBC: 14.7 10*3/uL — AB (ref 3.6–11.0)

## 2016-01-15 LAB — TROPONIN I

## 2016-01-15 SURGERY — OPEN REDUCTION INTERNAL FIXATION HIP
Anesthesia: General | Laterality: Right

## 2016-01-15 MED ORDER — MORPHINE SULFATE (PF) 2 MG/ML IV SOLN
2.0000 mg | INTRAVENOUS | Status: DC | PRN
  Administered 2016-01-15 (×2): 2 mg via INTRAVENOUS
  Filled 2016-01-15 (×3): qty 1

## 2016-01-15 MED ORDER — SUGAMMADEX SODIUM 200 MG/2ML IV SOLN
INTRAVENOUS | Status: DC | PRN
  Administered 2016-01-15: 116.4 mg via INTRAVENOUS

## 2016-01-15 MED ORDER — MORPHINE SULFATE (PF) 2 MG/ML IV SOLN
INTRAVENOUS | Status: AC
  Filled 2016-01-15: qty 1

## 2016-01-15 MED ORDER — ONDANSETRON HCL 4 MG PO TABS: 4.0000 mg | ORAL_TABLET | Freq: Four times a day (QID) | ORAL | Status: DC | PRN

## 2016-01-15 MED ORDER — SODIUM CHLORIDE 0.9% FLUSH
3.0000 mL | Freq: Two times a day (BID) | INTRAVENOUS | Status: DC
  Administered 2016-01-16 – 2016-01-18 (×3): 3 mL via INTRAVENOUS

## 2016-01-15 MED ORDER — ONDANSETRON HCL 4 MG/2ML IJ SOLN: 4.0000 mg | Freq: Four times a day (QID) | INTRAMUSCULAR | Status: DC | PRN

## 2016-01-15 MED ORDER — ROCURONIUM BROMIDE 100 MG/10ML IV SOLN
INTRAVENOUS | Status: DC | PRN
  Administered 2016-01-15: 20 mg via INTRAVENOUS
  Administered 2016-01-15: 10 mg via INTRAVENOUS
  Administered 2016-01-15: 20 mg via INTRAVENOUS

## 2016-01-15 MED ORDER — MORPHINE SULFATE (PF) 2 MG/ML IV SOLN
INTRAVENOUS | Status: AC
  Administered 2016-01-15: 2 mg via INTRAVENOUS
  Filled 2016-01-15: qty 1

## 2016-01-15 MED ORDER — BISACODYL 10 MG RE SUPP
10.0000 mg | Freq: Every day | RECTAL | Status: DC | PRN
  Administered 2016-01-18: 10 mg via RECTAL
  Filled 2016-01-15: qty 1

## 2016-01-15 MED ORDER — MORPHINE SULFATE (PF) 2 MG/ML IV SOLN
2.0000 mg | Freq: Once | INTRAVENOUS | Status: AC
Start: 2016-01-15 — End: 2016-01-15
  Administered 2016-01-15: 2 mg via INTRAVENOUS

## 2016-01-15 MED ORDER — POLYETHYLENE GLYCOL 3350 17 G PO PACK
17.0000 g | PACK | Freq: Every day | ORAL | Status: DC | PRN
  Filled 2016-01-15: qty 1

## 2016-01-15 MED ORDER — CALCIUM CARBONATE-VITAMIN D 500-200 MG-UNIT PO TABS
1.0000 | ORAL_TABLET | Freq: Every day | ORAL | Status: DC
  Administered 2016-01-16 – 2016-01-18 (×3): 1 via ORAL
  Filled 2016-01-15 (×3): qty 1

## 2016-01-15 MED ORDER — MAGNESIUM CITRATE PO SOLN
1.0000 | Freq: Once | ORAL | Status: DC | PRN
  Filled 2016-01-15: qty 296

## 2016-01-15 MED ORDER — PROPOFOL 10 MG/ML IV BOLUS
INTRAVENOUS | Status: DC | PRN
  Administered 2016-01-15: 50 mg via INTRAVENOUS

## 2016-01-15 MED ORDER — OXYCODONE HCL 5 MG PO TABS
5.0000 mg | ORAL_TABLET | ORAL | Status: DC | PRN
  Administered 2016-01-16 – 2016-01-17 (×2): 5 mg via ORAL
  Filled 2016-01-15 (×2): qty 1

## 2016-01-15 MED ORDER — LACTATED RINGERS IV SOLN
INTRAVENOUS | Status: DC | PRN
  Administered 2016-01-15 (×2): via INTRAVENOUS

## 2016-01-15 MED ORDER — LEVOTHYROXINE SODIUM 100 MCG PO TABS
100.0000 ug | ORAL_TABLET | Freq: Every day | ORAL | Status: DC
  Administered 2016-01-16 – 2016-01-18 (×3): 100 ug via ORAL
  Filled 2016-01-15 (×4): qty 1

## 2016-01-15 MED ORDER — ACETAMINOPHEN 650 MG RE SUPP: 650.0000 mg | Freq: Four times a day (QID) | RECTAL | Status: DC | PRN

## 2016-01-15 MED ORDER — OXYCODONE HCL 5 MG/5ML PO SOLN: 5.0000 mg | Freq: Once | ORAL | Status: DC | PRN

## 2016-01-15 MED ORDER — METOPROLOL TARTRATE 25 MG PO TABS
25.0000 mg | ORAL_TABLET | Freq: Two times a day (BID) | ORAL | Status: DC
  Administered 2016-01-15 – 2016-01-18 (×6): 25 mg via ORAL
  Filled 2016-01-15 (×7): qty 1

## 2016-01-15 MED ORDER — CEFAZOLIN SODIUM 1-5 GM-% IV SOLN
1.0000 g | Freq: Four times a day (QID) | INTRAVENOUS | Status: AC
  Administered 2016-01-15 – 2016-01-16 (×2): 1 g via INTRAVENOUS
  Filled 2016-01-15 (×2): qty 50

## 2016-01-15 MED ORDER — FENTANYL CITRATE (PF) 100 MCG/2ML IJ SOLN
25.0000 ug | INTRAMUSCULAR | Status: AC | PRN
  Administered 2016-01-15 (×6): 25 ug via INTRAVENOUS

## 2016-01-15 MED ORDER — ACETAMINOPHEN 325 MG PO TABS: 650.0000 mg | ORAL_TABLET | Freq: Four times a day (QID) | ORAL | Status: DC | PRN

## 2016-01-15 MED ORDER — SUCCINYLCHOLINE CHLORIDE 20 MG/ML IJ SOLN
INTRAMUSCULAR | Status: DC | PRN
  Administered 2016-01-15: 60 mg via INTRAVENOUS

## 2016-01-15 MED ORDER — DEXTROSE-NACL 5-0.9 % IV SOLN: INTRAVENOUS | Status: DC

## 2016-01-15 MED ORDER — GALANTAMINE HYDROBROMIDE 4 MG PO TABS
8.0000 mg | ORAL_TABLET | Freq: Every day | ORAL | Status: DC
  Administered 2016-01-15 – 2016-01-18 (×4): 8 mg via ORAL
  Filled 2016-01-15 (×4): qty 2

## 2016-01-15 MED ORDER — FENTANYL CITRATE (PF) 100 MCG/2ML IJ SOLN
INTRAMUSCULAR | Status: DC | PRN
  Administered 2016-01-15: 50 ug via INTRAVENOUS
  Administered 2016-01-15 (×2): 25 ug via INTRAVENOUS

## 2016-01-15 MED ORDER — OXYCODONE HCL 5 MG PO TABS: 5.0000 mg | ORAL_TABLET | Freq: Once | ORAL | Status: DC | PRN

## 2016-01-15 MED ORDER — SENNA 8.6 MG PO TABS
1.0000 | ORAL_TABLET | Freq: Two times a day (BID) | ORAL | Status: DC
Start: 2016-01-15 — End: 2016-01-18
  Administered 2016-01-16 – 2016-01-18 (×5): 8.6 mg via ORAL
  Filled 2016-01-15 (×5): qty 1

## 2016-01-15 MED ORDER — ENOXAPARIN SODIUM 40 MG/0.4ML ~~LOC~~ SOLN
40.0000 mg | SUBCUTANEOUS | Status: DC
  Administered 2016-01-16 – 2016-01-18 (×3): 40 mg via SUBCUTANEOUS
  Filled 2016-01-15 (×3): qty 0.4

## 2016-01-15 MED ORDER — NEOMYCIN-POLYMYXIN B GU 40-200000 IR SOLN
Status: DC | PRN
  Administered 2016-01-15: 4 mL

## 2016-01-15 MED ORDER — MORPHINE SULFATE (PF) 2 MG/ML IV SOLN
2.0000 mg | Freq: Once | INTRAVENOUS | Status: AC
Start: 2016-01-15 — End: 2016-01-15
  Administered 2016-01-15: 2 mg via INTRAVENOUS
  Filled 2016-01-15: qty 1

## 2016-01-15 MED ORDER — MENTHOL 3 MG MT LOZG
1.0000 | LOZENGE | OROMUCOSAL | Status: DC | PRN
  Filled 2016-01-15: qty 9

## 2016-01-15 MED ORDER — MORPHINE SULFATE (PF) 2 MG/ML IV SOLN: 2.0000 mg | INTRAVENOUS | Status: DC | PRN

## 2016-01-15 MED ORDER — SODIUM CHLORIDE 0.9 % IV SOLN
75.0000 mL/h | INTRAVENOUS | Status: DC
  Administered 2016-01-15 – 2016-01-16 (×2): 75 mL/h via INTRAVENOUS

## 2016-01-15 MED ORDER — FERROUS SULFATE 325 (65 FE) MG PO TABS
325.0000 mg | ORAL_TABLET | Freq: Three times a day (TID) | ORAL | Status: DC
  Administered 2016-01-16 – 2016-01-18 (×7): 325 mg via ORAL
  Filled 2016-01-15 (×7): qty 1

## 2016-01-15 MED ORDER — FENTANYL CITRATE (PF) 100 MCG/2ML IJ SOLN
INTRAMUSCULAR | Status: AC
  Filled 2016-01-15: qty 2

## 2016-01-15 MED ORDER — PHENYLEPHRINE HCL 10 MG/ML IJ SOLN
INTRAMUSCULAR | Status: DC | PRN
  Administered 2016-01-15 (×2): 50 ug via INTRAVENOUS
  Administered 2016-01-15: 100 ug via INTRAVENOUS

## 2016-01-15 MED ORDER — PHENOL 1.4 % MT LIQD
1.0000 | OROMUCOSAL | Status: DC | PRN
  Filled 2016-01-15: qty 177

## 2016-01-15 MED ORDER — ISOSORBIDE MONONITRATE ER 60 MG PO TB24
90.0000 mg | ORAL_TABLET | Freq: Every day | ORAL | Status: DC
  Administered 2016-01-15 – 2016-01-18 (×3): 90 mg via ORAL
  Filled 2016-01-15 (×4): qty 1

## 2016-01-15 MED ORDER — MOMETASONE FURO-FORMOTEROL FUM 100-5 MCG/ACT IN AERO
2.0000 | INHALATION_SPRAY | Freq: Two times a day (BID) | RESPIRATORY_TRACT | Status: DC
  Administered 2016-01-15 – 2016-01-18 (×4): 2 via RESPIRATORY_TRACT
  Filled 2016-01-15: qty 8.8

## 2016-01-15 MED ORDER — FENTANYL CITRATE (PF) 100 MCG/2ML IJ SOLN
INTRAMUSCULAR | Status: AC
Start: 2016-01-15 — End: 2016-01-16
  Filled 2016-01-15: qty 2

## 2016-01-15 MED ORDER — ACETAMINOPHEN 325 MG PO TABS
650.0000 mg | ORAL_TABLET | Freq: Four times a day (QID) | ORAL | Status: DC | PRN
  Administered 2016-01-16 – 2016-01-18 (×3): 650 mg via ORAL
  Filled 2016-01-15 (×3): qty 2

## 2016-01-15 MED ORDER — CEFAZOLIN SODIUM-DEXTROSE 2-3 GM-% IV SOLR
INTRAVENOUS | Status: DC | PRN
  Administered 2016-01-15: 2 g via INTRAVENOUS

## 2016-01-15 SURGICAL SUPPLY — 50 items
3.0 MM X 80CM BALL NOSE GUIDE WIRE ×2 IMPLANT
3.2MM X 444MM THREADED GUIDE PIN ×2 IMPLANT
4.3MM DISTAL GRADUATED DRILL SHORT ×2 IMPLANT
5.0MM X 50MM CORTICAL BONE SCREW ×2 IMPLANT
AFFIXUS CORTICAL BONE SCREW 5.0MM X46MM ×3 IMPLANT
BANDAGE ACE 6X5 VEL STRL LF (GAUZE/BANDAGES/DRESSINGS) ×3 IMPLANT
BIT DRILL 4.3MMS DISTAL GRDTED (BIT) ×1 IMPLANT
CANISTER SUCT 1200ML W/VALVE (MISCELLANEOUS) ×3 IMPLANT
CORTICAL BONE SCR 5.0MM X 46MM (Screw) ×3 IMPLANT
DRAPE INCISE IOBAN 66X45 STRL (DRAPES) ×3 IMPLANT
DRAPE SHEET LG 3/4 BI-LAMINATE (DRAPES) ×6 IMPLANT
DRAPE SURG 17X11 SM STRL (DRAPES) ×9 IMPLANT
DRAPE U-SHAPE 47X51 STRL (DRAPES) ×3 IMPLANT
DRAPE UTILITY 15X26 TOWEL STRL (DRAPES) ×6 IMPLANT
DRILL 4.3MMS DISTAL GRADUATED (BIT) ×3
DRSG OPSITE POSTOP 3X4 (GAUZE/BANDAGES/DRESSINGS) ×3 IMPLANT
DRSG OPSITE POSTOP 4X10 (GAUZE/BANDAGES/DRESSINGS) ×3 IMPLANT
DURAPREP 26ML APPLICATOR (WOUND CARE) ×3 IMPLANT
ELECT CAUTERY BLADE 6.4 (BLADE) IMPLANT
ELECT REM PT RETURN 9FT ADLT (ELECTROSURGICAL) ×3
ELECTRODE REM PT RTRN 9FT ADLT (ELECTROSURGICAL) ×1 IMPLANT
GAUZE SPONGE 4X4 12PLY STRL (GAUZE/BANDAGES/DRESSINGS) ×3 IMPLANT
GLOVE BIOGEL PI IND STRL 9 (GLOVE) ×3 IMPLANT
GLOVE BIOGEL PI INDICATOR 9 (GLOVE) ×6
GLOVE SURG 9.0 ORTHO LTXF (GLOVE) ×9 IMPLANT
GLOVE SURG LX XRAY STRL SZ9 (GLOVE) ×3 IMPLANT
GOWN STRL REUS TWL 2XL XL LVL4 (GOWN DISPOSABLE) ×3 IMPLANT
GOWN STRL REUS W/ TWL LRG LVL3 (GOWN DISPOSABLE) ×2 IMPLANT
GOWN STRL REUS W/TWL LRG LVL3 (GOWN DISPOSABLE) ×4
GUIDEPIN VERSANAIL DSP 3.2X444 ×3 IMPLANT
GUIDEWIRE BALL NOSE 80CM (WIRE) ×3 IMPLANT
HIP FRACTURE NAIL LAG SCREW ×2 IMPLANT
HIP FRACTURE NAIL-RIGHT ×2 IMPLANT
MAT BLUE FLOOR 46X72 FLO (MISCELLANEOUS) ×3 IMPLANT
NAIL HIP FRACTURE 11X380MM (Nail) ×3 IMPLANT
NEEDLE FILTER BLUNT 18X 1/2SAF (NEEDLE) ×2
NEEDLE FILTER BLUNT 18X1 1/2 (NEEDLE) ×1 IMPLANT
NS IRRIG 1000ML POUR BTL (IV SOLUTION) ×3 IMPLANT
PACK HIP COMPR (MISCELLANEOUS) ×3 IMPLANT
PAD ABD DERMACEA PRESS 5X9 (GAUZE/BANDAGES/DRESSINGS) ×3 IMPLANT
SCREW BONE CORTICAL 5.0X50 (Screw) ×3 IMPLANT
SCREW CORTICL BON 5.0MM X 46MM (Screw) ×1 IMPLANT
SCREW LAG 10.5MMX105MM HFN (Screw) ×3 IMPLANT
STAPLER SKIN PROX 35W (STAPLE) ×3 IMPLANT
STRAP SAFETY BODY (MISCELLANEOUS) ×3 IMPLANT
SUT VIC AB 0 CT2 27 (SUTURE) ×3 IMPLANT
SUT VIC AB 1 CT1 36 (SUTURE) IMPLANT
SUT VIC AB 2-0 CT1 (SUTURE) ×3 IMPLANT
SYRINGE 10CC LL (SYRINGE) ×3 IMPLANT
TAPE MICROFOAM 4IN (TAPE) ×3 IMPLANT

## 2016-01-15 NOTE — Progress Notes (Signed)
Discussed fracture and surgery with patient and family, they requested Dr Martha ClanKrasinski, he will assume orthopaedic care.

## 2016-01-15 NOTE — Progress Notes (Signed)
Notified Dr. Martha ClanKrasinski that patient with hip fracture and currently no order for catheter. MD ok with patient being catherized.

## 2016-01-15 NOTE — Transfer of Care (Signed)
Immediate Anesthesia Transfer of Care Note  Patient: Shelby George  Procedure(s) Performed: Procedure(s): OPEN REDUCTION INTERNAL FIXATION HIP (Right)  Patient Location: PACU  Anesthesia Type:General  Level of Consciousness: awake, alert  and patient cooperative  Airway & Oxygen Therapy: Patient Spontanous Breathing and Patient connected to face mask oxygen  Post-op Assessment: Report given to RN and Post -op Vital signs reviewed and stable  Post vital signs: Reviewed and stable  Last Vitals:  Filed Vitals:   01/15/16 0832 01/15/16 1710  BP: 116/42 116/41  Pulse: 77 71  Temp: 37.1 C 37.3 C  Resp: 20 20    Complications: No apparent anesthesia complications

## 2016-01-15 NOTE — Consult Note (Signed)
ORTHOPAEDIC CONSULTATION  REQUESTING PHYSICIAN: Enid Baas, MD  Chief Complaint: Right hip pain status post fall  HPI: Shelby George is a 82 y.o. female who complains of  right hip pain after falling at her assisted living facility yesterday. It is reported the patient fell in the bathroom. Patient was brought to the [facility] regional emergency room where she was diagnosed with an intertrochanteric hip fracture on the right side. This fracture is displaced. I have previously treated the patient for a left intertrochanteric hip fracture. Patient wished to have me take care of her for this injury as well. I'm taking over orthopedic care for Shelby George. Patient is seen in her room with her daughter at bedside. She appears comfortable currently. Patient does have tenderness 10 pain when she moves her leg.  Past Medical History  Diagnosis Date  . Hypothyroidism   . Lumbar compression fracture (HCC)   . Osteoporosis   . Pulmonary hypertension (HCC)   . Systemic primary arterial hypertension   . Sleep apnea   . Mild dementia    Past Surgical History  Procedure Laterality Date  . Kyphosis surgery    . Ankle fracture surgery      R ankle   Social History   Social History  . Marital Status: Married    Spouse Name: N/A  . Number of Children: 4  . Years of Education: N/A   Occupational History  . 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 Smoking status: Never Smoker   . Smokeless tobacco: Never Used  . Alcohol Use: No  . Drug Use: No  . Sexual Activity: Not Asked   Other Topics Concern  . None   Social History Narrative   Family History  Problem Relation Age of Onset  . Cerebral aneurysm Father    Allergies  Allergen Reactions  . Alendronate Sodium Other (See Comments)    Reaction:  Unknown   . Celebrex [Celecoxib] Other (See Comments)    Reaction:  Unknown    Prior to Admission medications   Medication Sig Start Date End  Date Taking? Authorizing Provider  budesonide-formoterol (SYMBICORT) 80-4.5 MCG/ACT inhaler Inhale 1 puff into the lungs 2 (two) times daily.   Yes Historical Provider, MD  Calcium Carbonate-Vitamin D (CALTRATE 600+D PO) Take 1 tablet by mouth daily.   Yes Historical Provider, MD  galantamine (RAZADYNE) 8 MG tablet Take 8 mg by mouth daily.   Yes Historical Provider, MD  isosorbide mononitrate (IMDUR) 60 MG 24 hr tablet Take 90 mg by mouth daily.    Yes Historical Provider, MD  levothyroxine (SYNTHROID, LEVOTHROID) 100 MCG tablet Take 100 mcg by mouth daily before breakfast.   Yes Historical Provider, MD  metoprolol tartrate (LOPRESSOR) 25 MG tablet Take 25 mg by mouth 2 (two) times daily.   Yes Historical Provider, MD   Ct Head Wo Contrast  01/15/2016  CLINICAL DATA:  Missed a step walking down stairs, falling to a tile floor. EXAM: CT HEAD WITHOUT CONTRAST TECHNIQUE: Contiguous axial images were obtained from the base of the skull through the vertex without intravenous contrast. COMPARISON:  None. FINDINGS: There is no intracranial hemorrhage or extra-axial fluid collection. There is moderate generalized atrophy. There is white matter hypodensity which is likely chronic and due to small vessel disease. Focally prominent hypodensity in the left frontal white matter may represent remote infarction. There probably also is remote lacunar infarction in the left pons. Calvarium and skullbase  are intact. Orbits are intact. IMPRESSION: Negative for acute intracranial traumatic injury. There is generalized atrophy, chronic white matter hypodensity and remote lacunar infarctions. Electronically Signed   By: Ellery Plunkaniel R Mitchell M.D.   On: 01/15/2016 00:31   Dg Chest Port 1 View  01/15/2016  CLINICAL DATA:  Larey SeatFell on tile floor and bathroom. History of pulmonary arterial hypertension, compression fractures. EXAM: PORTABLE CHEST 1 VIEW COMPARISON:  Chest radiograph November 20, 2014 FINDINGS: The cardiac silhouette is  mildly enlarged, unchanged. Calcified aortic knob. Increased lung volumes compatible with COPD without pleural effusion or focal consolidation. Multilevel thoracolumbar vertebral body cement augmentation. Patient's osteopenic. Healing RIGHT lateral rib fractures with callus. Old LEFT lateral rib fractures. IMPRESSION: Stable cardiomegaly and COPD. Electronically Signed   By: Awilda Metroourtnay  Bloomer M.D.   On: 01/15/2016 00:22   Dg Hip Unilat With Pelvis 2-3 Views Right  01/15/2016  CLINICAL DATA:  Larey SeatFell onto tile floor while trying to walk to the bathroom. EXAM: DG HIP (WITH OR WITHOUT PELVIS) 2-3V RIGHT COMPARISON:  12/07/2015 FINDINGS: There is an acute intertrochanteric right hip fracture with varus angulation. No dislocation. No bone lesion or bony destruction. No other acute findings are evident IMPRESSION: Intertrochanteric right hip fracture. Electronically Signed   By: Ellery Plunkaniel R Mitchell M.D.   On: 01/15/2016 00:21    Positive ROS: All other systems have been reviewed and were otherwise negative with the exception of those mentioned in the HPI and as above.  Physical Exam: General: Alert, no acute distress MUSCULOSKELETAL: Right lower extremity: Patient has intact skin. There is no erythema and only mild ecchymosis. She has had swelling over the right hip but her thigh compartments are soft and compressible. Distally she has palpable pedal pulses and intact sensation light touch. She has shortening and external rotation of the right lower extremity.  Assessment: Closed right intertrochanteric hip fracture  Plan: Patient has a displaced right intertrochanteric hip fracture. I'm recommending intramedullary fixation for this injury. Patient did well with intramedullary fixation for a left hip fracture. She has been cleared for surgery by the hospitalist service but is at moderate to high risk for surgery given her advanced age.  I reviewed the details of the surgery as well as the risks and benefits.  Patient and her daughter are aware of these risks and wished to proceed. Surgery scheduled for later this evening. Patient will remain nothing by mouth.    Juanell FairlyKRASINSKI, Niyah Mamaril, MD    01/15/2016 1:22 PM

## 2016-01-15 NOTE — H&P (Signed)
Shelby George is an 81 y.o. female.   Chief Complaint: Fall HPI: The patient presents to the emergency department after suffering a mechanical fall as she attempted to ambulate to the restroom. She hit her head but did not lose consciousness. She denies chest pain, shortness of breath, nausea, vomiting or diarrhea. She has fallen multiple times in the past and suffered other orthopedic injuries secondary to her osteoporosis. She was unable to stand after her fall and was found to have an intertrochanteric fracture of her right hip which brought to the emergency department staff to call for admission.  Past Medical History  Diagnosis Date  . Hypothyroidism   . Lumbar compression fracture ([place])   . Osteoporosis   . Pulmonary hypertension ([place])   . Systemic primary arterial hypertension   . Sleep apnea   . Mild dementia     Past Surgical History  Procedure Laterality Date  . Kyphosis surgery    . Ankle fracture surgery      R ankle    Family History  Problem Relation Age of Onset  . Cerebral aneurysm Father    Social History:  reports that she has never smoked. She has never used smokeless tobacco. She reports that she does not drink alcohol or use illicit drugs.  Allergies:  Allergies  Allergen Reactions  . Alendronate Sodium Other (See Comments)    Reaction:  Unknown   . Celebrex [Celecoxib] Other (See Comments)    Reaction:  Unknown     Medications Prior to Admission  Medication Sig Dispense Refill  . budesonide-formoterol (SYMBICORT) 80-4.5 MCG/ACT inhaler Inhale 1 puff into the lungs 2 (two) times daily.    . Calcium Carbonate-Vitamin D (CALTRATE 600+D PO) Take 1 tablet by mouth daily.    Marland Kitchen galantamine (RAZADYNE) 8 MG tablet Take 8 mg by mouth daily.    . isosorbide mononitrate (IMDUR) 60 MG 24 hr tablet Take 90 mg by mouth daily.     Marland Kitchen levothyroxine (SYNTHROID, LEVOTHROID) 100 MCG tablet Take 100 mcg by mouth daily before breakfast.    . metoprolol tartrate  (LOPRESSOR) 25 MG tablet Take 25 mg by mouth 2 (two) times daily.      Results for orders placed or performed during the hospital encounter of 01/14/16 (from the past 48 hour(s))  Basic metabolic panel     Status: Abnormal   Collection Time: 01/14/16 11:25 PM  Result Value Ref Range   Sodium 136 135 - 145 mmol/L   Potassium 4.5 3.5 - 5.1 mmol/L   Chloride 103 101 - 111 mmol/L   CO2 27 22 - 32 mmol/L   Glucose, Bld 109 (H) 65 - 99 mg/dL   BUN 28 (H) 6 - 20 mg/dL   Creatinine, Ser 0.79 0.44 - 1.00 mg/dL   Calcium 9.6 8.9 - 10.3 mg/dL   GFR calc non Af Amer >60 >60 mL/min   GFR calc Af Amer >60 >60 mL/min    Comment: (NOTE) The eGFR has been calculated using the CKD EPI equation. This calculation has not been validated in all clinical situations. eGFR's persistently <60 mL/min signify possible Chronic Kidney Disease.    Anion gap 6 5 - 15  CBC     Status: Abnormal   Collection Time: 01/14/16 11:25 PM  Result Value Ref Range   WBC 9.1 3.6 - 11.0 K/uL   RBC 4.09 3.80 - 5.20 MIL/uL   Hemoglobin 12.7 12.0 - 16.0 g/dL   HCT 37.9 35.0 - 47.0 %  MCV 92.6 80.0 - 100.0 fL   MCH 31.0 26.0 - 34.0 pg   MCHC 33.5 32.0 - 36.0 g/dL   RDW 15.1 (H) 11.5 - 14.5 %   Platelets 174 150 - 440 K/uL  Troponin I     Status: None   Collection Time: 01/14/16 11:25 PM  Result Value Ref Range   Troponin I <0.03 <0.031 ng/mL    Comment:        NO INDICATION OF MYOCARDIAL INJURY.   TSH     Status: None   Collection Time: 01/14/16 11:25 PM  Result Value Ref Range   TSH 0.479 0.350 - 4.500 uIU/mL   Ct Head Wo Contrast  01/15/2016  CLINICAL DATA:  Missed a step walking down stairs, falling to a tile floor. EXAM: CT HEAD WITHOUT CONTRAST TECHNIQUE: Contiguous axial images were obtained from the base of the skull through the vertex without intravenous contrast. COMPARISON:  None. FINDINGS: There is no intracranial hemorrhage or extra-axial fluid collection. There is moderate generalized atrophy. There is  white matter hypodensity which is likely chronic and due to small vessel disease. Focally prominent hypodensity in the left frontal white matter may represent remote infarction. There probably also is remote lacunar infarction in the left pons. Calvarium and skullbase are intact. Orbits are intact. IMPRESSION: Negative for acute intracranial traumatic injury. There is generalized atrophy, chronic white matter hypodensity and remote lacunar infarctions. Electronically Signed   By: Andreas Newport M.D.   On: 01/15/2016 00:31   Dg Chest Port 1 View  01/15/2016  CLINICAL DATA:  [place] on tile floor and bathroom. History of pulmonary arterial hypertension, compression fractures. EXAM: PORTABLE CHEST 1 VIEW COMPARISON:  Chest radiograph November 20, 2014 FINDINGS: The cardiac silhouette is mildly enlarged, unchanged. Calcified aortic knob. Increased lung volumes compatible with COPD without pleural effusion or focal consolidation. Multilevel thoracolumbar vertebral body cement augmentation. Patient's osteopenic. Healing RIGHT lateral rib fractures with callus. Old LEFT lateral rib fractures. IMPRESSION: Stable cardiomegaly and COPD. Electronically Signed   By: Elon Alas M.D.   On: 01/15/2016 00:22   Dg Hip Unilat With Pelvis 2-3 Views Right  01/15/2016  CLINICAL DATA:  [place] onto tile floor while trying to walk to the bathroom. EXAM: DG HIP (WITH OR WITHOUT PELVIS) 2-3V RIGHT COMPARISON:  12/07/2015 FINDINGS: There is an acute intertrochanteric right hip fracture with varus angulation. No dislocation. No bone lesion or bony destruction. No other acute findings are evident IMPRESSION: Intertrochanteric right hip fracture. Electronically Signed   By: Andreas Newport M.D.   On: 01/15/2016 00:21    Review of Systems  Constitutional: Negative for fever and chills.  HENT: Negative for sore throat and tinnitus.   Eyes: Negative for blurred vision and redness.  Respiratory: Negative for cough and shortness  of breath.   Cardiovascular: Negative for chest pain, palpitations, orthopnea and PND.  Gastrointestinal: Negative for nausea, vomiting, abdominal pain and diarrhea.  Genitourinary: Negative for dysuria, urgency and frequency.  Musculoskeletal: Positive for joint pain and falls. Negative for myalgias.  Skin: Negative for rash.       No lesions  Neurological: Negative for speech change, focal weakness and weakness.  Endo/Heme/Allergies: Does not bruise/bleed easily.       No temperature intolerance  Psychiatric/Behavioral: Negative for depression and suicidal ideas.    Blood pressure 166/49, pulse 70, temperature 97.7 F (36.5 C), temperature source Oral, resp. rate 18, height 5' 4"  (1.626 m), weight 58.968 kg (130 lb), SpO2 99 %. Physical  Exam  Vitals reviewed. Constitutional: She is oriented to person, place, and time. She appears well-developed and well-nourished. No distress.  HENT:  Head: Normocephalic.  Mouth/Throat: Oropharynx is clear and moist. No oropharyngeal exudate.  Contusion above right brow  Eyes: Conjunctivae and EOM are normal. Pupils are equal, round, and reactive to light.  Neck: Normal range of motion. Neck supple. No JVD present. No tracheal deviation present. No thyromegaly present.  Cardiovascular: Normal rate, regular rhythm and normal heart sounds.  Exam reveals no gallop and no friction rub.   No murmur heard. Respiratory: Effort normal and breath sounds normal.  GI: Soft. Bowel sounds are normal. She exhibits no distension. There is no tenderness.  Genitourinary:  Deferred  Musculoskeletal: She exhibits no edema.  Right lower extremity externally rotated  Lymphadenopathy:    She has no cervical adenopathy.  Neurological: She is alert and oriented to person, place, and time. No cranial nerve deficit. She exhibits normal muscle tone.  Skin: Skin is warm and dry. No rash noted. No erythema.  Psychiatric: She has a normal mood and affect. Her behavior is  normal. Judgment and thought content normal.     Assessment/Plan This is a 82 year old female admitted for right hip fracture. 1. Hip fracture: Right intertrochanteric fracture; manage pain. Orthopedic surgery on board. The patient is a moderate to high risk given her advanced age as well as pulmonary hypertension and osteoporosis. 2. Osteoporosis: Soft with vitamin D and calcium. Alendronate weekly 3. Hypothyroidism: Continue Synthroid 4. COPD: Continue inhaled corticosteroid 5. Essential hypertension: continue metoprolol 6. Obstructive sleep apnea: Continue nocturnal CPAP per home settings 7. Pulmonary hypertension: Continue Imdur 8. Dementia: Mild; continue Razadyne  9. DVT prophylaxis: SCDs 10. GI prophylaxis: None The patient is a full code. Time spent on admission orders and patient care approximately 45 minutes  Harrie Foreman, MD 01/15/2016, 5:38 AM

## 2016-01-15 NOTE — Progress Notes (Signed)
Per patients 3 daughters the patient should be a DNR.  She had completed forms for this last year and is in agreement.

## 2016-01-15 NOTE — ED Notes (Signed)
Pt placed on 2 L nasal cannula, oxygen saturation ranging 89%-90%, per MD order.

## 2016-01-15 NOTE — Care Management Note (Signed)
Case Management Note  Patient Details  Name: Shelby George MRN: [id number] Date of Birth: December 14, 1915  Subjective/Objective:                  Met with patient's daughter Audrea Muscat as patient slept. Patient is pending surgery. She is on O2 continuous right now but at home she is on nocturnal O2 only through [place]. She recently moved from independent living at [facility] into [place] at [facility]. Daughter states that "African American MD in ED told her that she could have what ever surgeon she wanted to repair her mother's hip and she requested Dr. Mack Guise" which was not on-call (Dr Rudene Christians was on call). I explained that there may be a delay in surgery if Dr. Mack Guise is not available- she is disappointed and does not want to delay her surgery because of that. Patient uses a rollator at ALF to ambulate. She can obtain a front-wheeled walker if needed.   Action/Plan: List of home health agencies left with daughter. RNCM will continue to follow along with CSW.   Expected Discharge Date:                  Expected Discharge Plan:     In-House Referral:     Discharge planning Services  CM Consult  Post Acute Care Choice:  Home Health Choice offered to:  Patient  DME Arranged:    DME Agency:     HH Arranged:    [place] Agency:     Status of Service:  In process, will continue to follow  Medicare Important Message Given:    Date Medicare IM Given:    Medicare IM give by:    Date Additional Medicare IM Given:    Additional Medicare Important Message give by:     If discussed at [place] of Stay Meetings, dates discussed:    Additional Comments:  Marshell Garfinkel, RN 01/15/2016, 9:06 AM

## 2016-01-15 NOTE — Anesthesia Procedure Notes (Signed)
Procedure Name: Intubation Date/Time: 01/15/2016 6:47 PM Performed by: Waldo LaineJUSTIS, Dajon Lazar Pre-anesthesia Checklist: Emergency Drugs available, Suction available, Patient being monitored, Timeout performed and Patient identified Patient Re-evaluated:Patient Re-evaluated prior to inductionOxygen Delivery Method: Circle system utilized Preoxygenation: Pre-oxygenation with 100% oxygen Intubation Type: IV induction Ventilation: Mask ventilation without difficulty Laryngoscope Size: Miller and 2 Grade View: Grade III Number of attempts: 2 Airway Equipment and Method: Stylet Placement Confirmation: ETT inserted through vocal cords under direct vision,  positive ETCO2 and breath sounds checked- equal and bilateral Secured at: 20 cm Tube secured with: Tape Dental Injury: Teeth and Oropharynx as per pre-operative assessment

## 2016-01-15 NOTE — Anesthesia Preprocedure Evaluation (Addendum)
Anesthesia Evaluation  Patient identified by MRN, date of birth, ID band Patient confused  General Assessment Comment:Patient heavily sedated, does not follow commands or answer questions.  History obtained from daughter.  Reviewed: Allergy & Precautions, H&P , NPO status , Patient's Chart, lab work & pertinent test results  History of Anesthesia Complications (+) PROLONGED EMERGENCE, Emergence Delirium and history of anesthetic complications  Airway Mallampati: III  TM Distance: >3 FB Neck ROM: limited    Dental  (+) Poor Dentition, Chipped   Pulmonary shortness of breath and with exertion, sleep apnea , COPD,  COPD inhaler,    Pulmonary exam normal breath sounds clear to auscultation       Cardiovascular Exercise Tolerance: Poor hypertension, (-) angina+ DOE  (-) Past MI negative cardio ROS Normal cardiovascular exam Rhythm:regular Rate:Normal     Neuro/Psych PSYCHIATRIC DISORDERS    GI/Hepatic negative GI ROS, Neg liver ROS,   Endo/Other  Hypothyroidism   Renal/GU negative Renal ROS  negative genitourinary   Musculoskeletal   Abdominal   Peds  Hematology negative hematology ROS (+)   Anesthesia Other Findings Past Medical History:   Hypothyroidism                                               Lumbar compression fracture (HCC)                            Osteoporosis                                                 Pulmonary hypertension (HCC)                                 Systemic primary arterial hypertension                       Sleep apnea                                                  Mild dementia                                               Past Surgical History:   KYPHOSIS SURGERY                                              ANKLE FRACTURE SURGERY                                          Comment:R ankle  BMI    Body Mass Index   21.99 kg/m 2      Reproductive/Obstetrics negative  OB ROS                           Anesthesia Physical Anesthesia Plan  ASA: IV  Anesthesia Plan: General   Post-op Pain Management:    Induction:   Airway Management Planned:   Additional Equipment:   Intra-op Plan:   Post-operative Plan:   Informed Consent: I have reviewed the patients History and Physical, chart, labs and discussed the procedure including the risks, benefits and alternatives for the proposed anesthesia with the patient or authorized representative who has indicated his/her understanding and acceptance.   Dental Advisory Given  Plan Discussed with: Anesthesiologist, CRNA and Surgeon  Anesthesia Plan Comments: (Daughter consented, she was informed that patient is higher risk for complications from anesthesia during this procedure due to her medical history including prolonged intubation.  She voiced understanding. Spinal vs GA discussed with daughter, at this time I feel that we can more stately take care of this patient with a general anesthetic. )        Anesthesia Quick Evaluation

## 2016-01-15 NOTE — NC FL2 (Signed)
[place] MEDICAID FL2 LEVEL OF CARE SCREENING TOOL     IDENTIFICATION  Patient Name: Shelby George Birthdate: [birth year] Sex: female Admission Date (Current Location): 01/14/2016  [place] and [place] Number:  Chiropodist and Address:  [facility], [address] [place], [place] [address]      Provider Number: [id number]  Attending Physician Name and Address:  Enid Baas, MD  Relative Name and Phone Number:       Current Level of Care: Hospital Recommended Level of Care: Skilled Nursing Facility Prior Approval Number:    Date Approved/Denied:   PASRR Number:  [id number])  Discharge Plan: SNF    Current Diagnoses: Patient Active Problem List   Diagnosis Date Noted  . Hip fracture (HCC) 01/15/2016  . COPD (chronic obstructive pulmonary disease) (HCC) 09/07/2011  . Hypothyroidism   . Lumbar compression fracture (HCC)   . Osteoporosis   . Systemic primary arterial hypertension   . Sleep apnea     Orientation RESPIRATION BLADDER Height & Weight     Self, Time, Situation, Place  O2 (2 Liters Oxygen ) Continent Weight: 128 lb 3.2 oz (58.151 kg) Height:   (162.6 cm)  BEHAVIORAL SYMPTOMS/MOOD NEUROLOGICAL BOWEL NUTRITION STATUS   (none )  (none ) Continent Diet (NPO for surgery )  AMBULATORY STATUS COMMUNICATION OF NEEDS Skin   Extensive Assist Verbally Surgical wounds, Other (Comment) (Skin Tear Right Arm. )                       Personal Care Assistance Level of Assistance  Bathing, Feeding, Dressing Bathing Assistance: Limited assistance Feeding assistance: Independent Dressing Assistance: Limited assistance     Functional Limitations Info  Sight, Hearing, Speech Sight Info: Adequate Hearing Info: Adequate Speech Info: Adequate    SPECIAL CARE FACTORS FREQUENCY  PT (By licensed PT), OT (By licensed OT)     PT Frequency:  (5) OT Frequency:  (5)            Contractures      Additional  Factors Info  Code Status, Allergies Code Status Info:  (Full Code. ) Allergies Info:  (Alendronate Sodium, Celebrex)           Current Medications (01/15/2016):  This is the current hospital active medication list Current Facility-Administered Medications  Medication Dose Route Frequency Provider Last Rate Last Dose  . acetaminophen (TYLENOL) tablet 650 mg  650 mg Oral Q6H PRN Arnaldo Natal, MD       Or  . acetaminophen (TYLENOL) suppository 650 mg  650 mg Rectal Q6H PRN Arnaldo Natal, MD      . calcium-vitamin D (OSCAL WITH D) 500-200 MG-UNIT per tablet 1 tablet  1 tablet Oral Daily Arnaldo Natal, MD   1 tablet at 01/15/16 1000  . dextrose 5 %-0.9 % sodium chloride infusion   Intravenous Continuous Arnaldo Natal, MD 100 mL/hr at 01/15/16 1500    . galantamine (RAZADYNE) tablet 8 mg  8 mg Oral Daily Arnaldo Natal, MD   8 mg at 01/15/16 1458  . isosorbide mononitrate (IMDUR) 24 hr tablet 90 mg  90 mg Oral Daily Arnaldo Natal, MD   90 mg at 01/15/16 1458  . levothyroxine (SYNTHROID, LEVOTHROID) tablet 100 mcg  100 mcg Oral QAC breakfast Arnaldo Natal, MD   100 mcg at 01/15/16 0800  . metoprolol tartrate (LOPRESSOR) tablet 25 mg  25 mg Oral BID Arnaldo Natal, MD  25 mg at 01/15/16 1459  . mometasone-formoterol (DULERA) 100-5 MCG/ACT inhaler 2 puff  2 puff Inhalation BID Arnaldo NatalMichael S Diamond, MD   2 puff at 01/15/16 [phone]  . morphine 2 MG/ML injection 2 mg  2 mg Intravenous Q4H PRN Arnaldo NatalMichael S Diamond, MD   2 mg at 01/15/16 1205  . ondansetron (ZOFRAN) tablet 4 mg  4 mg Oral Q6H PRN Arnaldo NatalMichael S Diamond, MD       Or  . ondansetron [facility](ZOFRAN) injection 4 mg  4 mg Intravenous Q6H PRN Arnaldo NatalMichael S Diamond, MD      . sodium chloride flush (NS) 0.9 % injection 3 mL  3 mL Intravenous Q12H Arnaldo NatalMichael S Diamond, MD         Discharge Medications: Please see discharge summary for a list of discharge medications.  Relevant Imaging Results:  Relevant Lab Results:   Additional  Information  (SSN: [id number])  Haig ProphetMorgan, Seng Fouts G, LCSW

## 2016-01-15 NOTE — Op Note (Addendum)
DATE OF SURGERY:  01/15/2016  TIME: 8:46 PM  PATIENT NAME:  Shelby George  AGE: 82 y.o.  PRE-OPERATIVE DIAGNOSIS:  Right intertrochanteric hip  POST-OPERATIVE DIAGNOSIS:  SAME  PROCEDURE:  OPEN REDUCTION INTERNAL FIXATION RIGHT INTERTROCHATERIC HIP FRACTURE  SURGEON:  Juanell FairlyKRASINSKI, Ayala Ribble  OPERATIVE IMPLANTS: Biomet Long Affixus nail 11 x 380mm, 105 mm lag screw with 46 and 50 mm distal interlocking screws  EBL:  250 cc  COMPLICATIONS:  None  PREOPERATIVE INDICATIONS:  Shelby PimpleMildred E Therriault is a 82 y.o. year old who fell and suffered a hip fracture. She was brought into the ER and then admitted and medically cleared for surgical intervention.  I have previously fixed the patient's left intertrochanteric hip fracture. The patient is familiar with the operation and the postoperative course.  The risks, benefits and alternatives were discussed with the patient and her daughter.  The risks include but are not limited to infection, bleeding, nerve or blood vessel injury, malunion, nonunion, hardware prominence, hardware failure, change in leg lengths or lower extremity rotation need for further surgery including hardware removal with conversion to a total hip arthroplasty. Medical risks include but are not limited to DVT and pulmonary embolism, myocardial infarction, stroke, pneumonia, respiratory failure and death. The patient and her daughter understood these risks and wished to proceed with surgery.  OPERATIVE PROCEDURE:  The patient was brought to the operating room and placed in the supine position on the fracture table. General anesthesia was administered.  A closed reduction was performed under C-arm guidance.  The fracture reduction was confirmed on both AP and lateral views. A time out was performed to verify the patient's name, date of birth, medical record number, correct site of surgery correct procedure to be performed. The timeout was also used to verify the patient received antibiotics  and all appropriate instruments, implants and radiographic studies were available in the room. Once all in attendance were in agreement, the case began. The patient was prepped and draped in a sterile fashion. She received preoperative antibiotics.  An incision was made proximal to the greater trochanter in line with the femur. A guidewire was placed over the tip of the greater trochanter and advanced into the proximal femur to the level of the lesser trochanter.  Confirmation of the drill pin position was made on AP and lateral C-arm images.  The threaded guidepin was then overdrilled with the proximal femoral drill.  The nail was then inserted into the proximal femur, across the fracture site and into the femoral shaft. Its position was confirmed on AP and lateral C-arm images.   Once the nail was completely seated, the drill guide for the lag screw was placed through the guide arm for the Affixus nail. A guidepin was then placed through this drill guide and advanced through the lateral cortex of the femur, across the fracture site and into the femoral head achieving a tip apex distance of less than 25 mm. The length of the drill pin was measured, and then the drill for the lag screw was advanced through the lateral cortex, across the fracture site and up into the femoral head to the depth of the lag screw..  The lag screw was then advanced by hand into position across the fracture site into the femoral head. Its final position was confirmed on AP and lateral C-arm images. Compression was applied as traction was carefully released. The set screw in the top of the intramedullary rod was tightened by hand using a screwdriver.  Attention was then turned to placement of the distal interlocking screws. A perfect circle technique was used for distal interlocking screw placement. A stab incision was made over each distal hole. A freehand technique was used to drill bicortically through both distal holes in the  nail. A depth gauge was used to measure the depth of each screw. These were then advanced into position by hand.  Final C-arm images of the entire intramedullary construct were taken in both the AP and lateral planes.   The wounds were irrigated copiously and closed with 0 Vicryl for closure of the deep fascia and 2-0 Vicryl for subcutaneous closure. The skin was approximated with staples. A dry sterile dressing was applied. I was scrubbed and present the entire case and all sharp and instrument counts were correct at the conclusion of the case. Patient was transferred to hospital bed and brought to PACU in stable condition. I spoke with the patient's family in the postop consultation room to let them know the case had gone without complication and the patient was stable in the recovery room.    She will be weightbearing as tolerated and begin physical and occupational therapy tomorrow. The patient will started on medical DVT prophylaxis with lovenox tomorrow.    Kathreen Devoid, MD

## 2016-01-15 NOTE — Progress Notes (Addendum)
[facility] - [place] at [facility]   PATIENT NAME: Shelby George    MR#:  [id number]  DATE OF BIRTH:  1916/05/25  SUBJECTIVE:  CHIEF COMPLAINT:   Chief Complaint  Patient presents with  . Fall   - Admitted with fall and right hip fracture. Denies any pain at this time. For surgery this evening. -Resting, daughter at bedside.  REVIEW OF SYSTEMS:  Review of Systems  Constitutional: Negative for fever and chills.  HENT: Positive for hearing loss. Negative for ear discharge, ear pain and nosebleeds.   Eyes: Negative for blurred vision and double vision.  Respiratory: Negative for cough, shortness of breath and wheezing.   Cardiovascular: Negative for chest pain, palpitations and leg swelling.  Gastrointestinal: Negative for nausea, vomiting, abdominal pain, diarrhea and constipation.  Genitourinary: Negative for dysuria.  Musculoskeletal: Positive for joint pain.  Neurological: Negative for dizziness, speech change, focal weakness, seizures and headaches.  Psychiatric/Behavioral: Negative for depression.    DRUG ALLERGIES:   Allergies  Allergen Reactions  . Alendronate Sodium Other (See Comments)    Reaction:  Unknown   . Celebrex [Celecoxib] Other (See Comments)    Reaction:  Unknown     VITALS:  Blood pressure 116/42, pulse 77, temperature 98.8 F (37.1 C), temperature source Axillary, resp. rate 20, height  (1.626 m), weight 58.151 kg (128 lb 3.2 oz), SpO2 92 %.  PHYSICAL EXAMINATION:  Physical Exam  GENERAL:  82 y.o.-year-old patient lying in the bed with no acute distress.  EYES: Pupils equal, round, reactive to light and accommodation. No scleral icterus. Extraocular muscles intact.  HEENT: Head atraumatic, normocephalic. Oropharynx and nasopharynx clear.  NECK:  Supple, no jugular venous distention. No thyroid enlargement, no tenderness.  LUNGS: Normal breath sounds bilaterally, no wheezing, rales,rhonchi or crepitation. No use of  accessory muscles of respiration. Decreased bibasilar breath sounds noted CARDIOVASCULAR: S1, S2 normal. No  rubs, or gallops. 2/6 systolic murmur is present ABDOMEN: Soft, nontender, nondistended. Bowel sounds present. No organomegaly or mass.  EXTREMITIES: No pedal edema, cyanosis, or clubbing. Right leg is abducted and externally rotated NEUROLOGIC: Cranial nerves II through XII are intact. Following commands. Muscle strength is equal in all extremities except right leg which is limited due to the fracture. Sensation intact. Gait not checked.  PSYCHIATRIC: The patient is alert and oriented x 2.  SKIN: No obvious rash, lesion, or ulcer.    LABORATORY PANEL:   CBC  Recent Labs Lab 01/14/16 2325  WBC 9.1  HGB 12.7  HCT 37.9  PLT 174   ------------------------------------------------------------------------------------------------------------------  Chemistries   Recent Labs Lab 01/14/16 2325  NA 136  K 4.5  CL 103  CO2 27  GLUCOSE 109*  BUN 28*  CREATININE 0.79  CALCIUM 9.6   ------------------------------------------------------------------------------------------------------------------  Cardiac Enzymes  Recent Labs Lab 01/14/16 2325  TROPONINI <0.03   ------------------------------------------------------------------------------------------------------------------  RADIOLOGY:  Ct Head Wo Contrast  01/15/2016  CLINICAL DATA:  Missed a step walking down stairs, falling to a tile floor. EXAM: CT HEAD WITHOUT CONTRAST TECHNIQUE: Contiguous axial images were obtained from the base of the skull through the vertex without intravenous contrast. COMPARISON:  None. FINDINGS: There is no intracranial hemorrhage or extra-axial fluid collection. There is moderate generalized atrophy. There is white matter hypodensity which is likely chronic and due to small vessel disease. Focally prominent hypodensity in the left frontal white matter may represent remote infarction. There  probably also is remote lacunar infarction in the left pons. Calvarium and  skullbase are intact. Orbits are intact. IMPRESSION: Negative for acute intracranial traumatic injury. There is generalized atrophy, chronic white matter hypodensity and remote lacunar infarctions. Electronically Signed   By: Ellery Plunkaniel R Mitchell M.D.   On: 01/15/2016 00:31   Dg Chest Port 1 View  01/15/2016  CLINICAL DATA:  Larey SeatFell on tile floor and bathroom. History of pulmonary arterial hypertension, compression fractures. EXAM: PORTABLE CHEST 1 VIEW COMPARISON:  Chest radiograph November 20, 2014 FINDINGS: The cardiac silhouette is mildly enlarged, unchanged. Calcified aortic knob. Increased lung volumes compatible with COPD without pleural effusion or focal consolidation. Multilevel thoracolumbar vertebral body cement augmentation. Patient's osteopenic. Healing RIGHT lateral rib fractures with callus. Old LEFT lateral rib fractures. IMPRESSION: Stable cardiomegaly and COPD. Electronically Signed   By: Awilda Metroourtnay  Bloomer M.D.   On: 01/15/2016 00:22   Dg Hip Unilat With Pelvis 2-3 Views Right  01/15/2016  CLINICAL DATA:  Larey SeatFell onto tile floor while trying to walk to the bathroom. EXAM: DG HIP (WITH OR WITHOUT PELVIS) 2-3V RIGHT COMPARISON:  12/07/2015 FINDINGS: There is an acute intertrochanteric right hip fracture with varus angulation. No dislocation. No bone lesion or bony destruction. No other acute findings are evident IMPRESSION: Intertrochanteric right hip fracture. Electronically Signed   By: Ellery Plunkaniel R Mitchell M.D.   On: 01/15/2016 00:21    EKG:   Orders placed or performed during the hospital encounter of 01/14/16  . ED EKG  . ED EKG  . EKG 12-Lead  . EKG 12-Lead    ASSESSMENT AND PLAN:   82 year old female with past medical history significant for osteoporosis, pulmonary hypertension, systemic hypertension, mild dementia presents to the hospital after a fall and noted to have right hip fracture.  #1 right hip  fracture-appreciate orthopedics consult. -For surgery later today. Pain control. -Postoperative DVT prophylaxis and physical therapy consults. -Seen by admitting physician and deemed to be moderate to high risk for surgery. Family aware. - Hemoglobin monitoring post op  #2 hypertension-continue Imdur and metoprolol  #3 COPD- continue her inhalers  #4 mild dementia-continue Galantamine  #5 DVT Prophylaxis- after the surgery   All the records are reviewed and case discussed with Care Management/Social Workerr. Management plans discussed with the patient, family and they are in agreement.  CODE STATUS: Full code  TOTAL TIME TAKING CARE OF THIS PATIENT: 37 minutes.   POSSIBLE D/C IN 1-2 DAYS, DEPENDING ON CLINICAL CONDITION.   Enid BaasKALISETTI,Jessamine Barcia M.D on 01/15/2016 at 2:13 PM  Between 7am to 6pm - Pager - [phone]  After 6pm go to [email or web address] - password [email or web address] [facility]  [place] [facility]  Office  [phone]  CC: Primary care physician; Danella PentonMark F Miller, MD

## 2016-01-15 NOTE — Progress Notes (Signed)
Patient back from OR. Patient alert to self. Dressing CDI. Ice pack applied. Foley intact. IV fluids infusing. Caregiver at bedside. Patient demonstrates correct use of incentive spirometer with teaching.

## 2016-01-15 NOTE — Clinical Social Work Note (Signed)
Clinical Social Work Assessment  Patient Details  Name: Shelby George MRN: [id number] Date of Birth: [birth year]  Date of referral:  01/15/16               Reason for consult:  Facility Placement                Permission sought to share information with:  Chartered certified accountant granted to share information::  Yes, Verbal Permission Granted  Name::      [place]::   [place]   Relationship::     Contact Information:     Housing/Transportation Living arrangements for the past 2 months:  [place] of Information:  Patient, Adult Children Patient Interpreter Needed:  None Criminal Activity/Legal Involvement Pertinent to Current Situation/Hospitalization:  No - Comment as needed Significant Relationships:  Adult Children, Siblings Lives with:  Facility Resident Do you feel safe going back to the place where you live?  Yes Need for family participation in patient care:  Yes (Comment)  Care giving concerns:  Patient has been a resident at [facility] since Sunday 01/13/16.    Social Worker assessment / plan:  Holiday representative (CSW) received consult that patient is from [facility] pending surgery. CSW met with patient and her daughter Shelby George [phone] was at bedside. CSW introduced self and explained role of CSW department. Daughter reported that patient just moved in [place] from [place] living and had a fall. CSW explained to daughter that based on patient's age and injury she will likely have to go to SNF for STR before returning to [facility]. Daughter is agreeable to SNF search and prefers [facility]. CSW contacted Shelby George and spoke with [facility] Director. Per Shelby George patient is private pay walks with a walker and was about to start PT at ALF.   FL2 complete and faxed out. CSW will continue to follow and assist as needed.   Employment status:  Retired, Disabled  (Comment on whether or not currently receiving Disability) Insurance information:  Managed Medicare PT Recommendations:  Not assessed at this time Information / Referral to community resources:  [place]  Patient/Family's Response to care:  [name] is agreeable to [facility].   Patient/Family's Understanding of and Emotional Response to Diagnosis, Current Treatment, and Prognosis:  Daughter was pleasant and thanked CSW for visit.   Emotional Assessment Appearance:  Appears stated age Attitude/Demeanor/Rapport:    Affect (typically observed):  Pleasant Orientation:  Oriented to Self, Oriented to Place, Fluctuating Orientation (Suspected and/or reported Sundowners) Alcohol / Substance use:  Not Applicable Psych involvement (Current and /or in the community):  No (Comment)  Discharge Needs  Concerns to be addressed:  Discharge Planning Concerns Readmission within the last 30 days:  No Current discharge risk:  Dependent with Mobility Barriers to Discharge:  Continued Medical Work up   Shelby Freshwater, LCSW 01/15/2016, 5:18 PM

## 2016-01-15 NOTE — Clinical Social Work Placement (Signed)
   CLINICAL SOCIAL WORK PLACEMENT  NOTE  Date:  01/15/2016  Patient Details  Name: Shelby George MRN: [id number] Date of Birth: January 18, 1916  Clinical Social Work is seeking post-discharge placement for this patient at the Skilled  Nursing Facility level of care (*CSW will initial, date and re-position this form in  chart as items are completed):  Yes   Patient/family provided with [place] Clinical Social Work Department's list of facilities offering this level of care within the geographic area requested by the patient (or if unable, by the patient's family).  Yes   Patient/family informed of their freedom to choose among providers that offer the needed level of care, that participate in Medicare, Medicaid or managed care program needed by the patient, have an available bed and are willing to accept the patient.  Yes   Patient/family informed of [facility]'s ownership interest in [facility] and [facility], as well as of the fact that they are under no obligation to receive care at these facilities.  PASRR submitted to EDS on       PASRR number received on       Existing PASRR number confirmed on 01/15/16     FL2 transmitted to all facilities in geographic area requested by pt/family on 01/15/16     FL2 transmitted to all facilities within larger geographic area on       Patient informed that his/her managed care company has contracts with or will negotiate with certain facilities, including the following:            Patient/family informed of bed offers received.  Patient chooses bed at       Physician recommends and patient chooses bed at      Patient to be transferred to   on  .  Patient to be transferred to facility by       Patient family notified on   of transfer.  Name of family member notified:        PHYSICIAN       Additional Comment:    _______________________________________________ Haig ProphetMorgan, Ajeenah Heiny G, LCSW 01/15/2016, 5:17 PM

## 2016-01-15 NOTE — Progress Notes (Signed)
ANTICOAGULATION CONSULT NOTE - Initial Consult  Pharmacy Consult for enoxaparin Indication: VTE prophylaxis  Allergies  Allergen Reactions  . Alendronate Sodium Other (See Comments)    Reaction:  Unknown   . Celebrex [Celecoxib] Other (See Comments)    Reaction:  Unknown     Patient Measurements: Height: 5\' 4"  (162.6 cm) Weight: 128 lb 3.2 oz (58.151 kg) IBW/kg (Calculated) : 54.7 Heparin Dosing Weight:   Vital Signs: Temp: 98.6 F (37 C) (04/11 2153) Temp Source: Oral (04/11 2153) BP: 141/54 mmHg (04/11 2153) Pulse Rate: 120 (04/11 2153)  Labs:  Recent Labs  01/14/16 2325  HGB 12.7  HCT 37.9  PLT 174  CREATININE 0.79  TROPONINI <0.03    Estimated Creatinine Clearance: 34.7 mL/min (by C-G formula based on Cr of 0.79).   Medical History: Past Medical History  Diagnosis Date  . Hypothyroidism   . Lumbar compression fracture (HCC)   . Osteoporosis   . Pulmonary hypertension (HCC)   . Systemic primary arterial hypertension   . Sleep apnea   . Mild dementia     Medications:  Infusions:  . sodium chloride 75 mL/hr (01/15/16 2133)  . dextrose 5 % and 0.9% NaCl 100 mL/hr at 01/15/16 1500    Assessment: 89 yof cc fall/hip fracture. Surgeon entered Lovenox 40 mg subcutaneously every 24 hours starting at 0800 on 01/16/16. That dose is appropriate for her BMI and CrCl. Pharmacy will continue to monitor labs per protocol.   Goal of Therapy:  Monitor platelets by anticoagulation protocol: Yes   Plan:  Lovenox 40 mg subcutaneously once daily starting at 0800 on 01/16/2016.  Carola FrostNathan A Anayelli Lai, Pharm.D., BCPS Clinical Pharmacist 01/15/2016,10:23 PM

## 2016-01-16 ENCOUNTER — Encounter: Payer: Self-pay | Admitting: Orthopedic Surgery

## 2016-01-16 LAB — CBC
HCT: 25.9 % — ABNORMAL LOW (ref 35.0–47.0)
HEMOGLOBIN: 8.7 g/dL — AB (ref 12.0–16.0)
MCH: 31.2 pg (ref 26.0–34.0)
MCHC: 33.7 g/dL (ref 32.0–36.0)
MCV: 92.8 fL (ref 80.0–100.0)
Platelets: 111 10*3/uL — ABNORMAL LOW (ref 150–440)
RBC: 2.8 MIL/uL — AB (ref 3.80–5.20)
RDW: 14.7 % — ABNORMAL HIGH (ref 11.5–14.5)
WBC: 10.7 10*3/uL (ref 3.6–11.0)

## 2016-01-16 LAB — BASIC METABOLIC PANEL
ANION GAP: 0 — AB (ref 5–15)
BUN: 18 mg/dL (ref 6–20)
CHLORIDE: 108 mmol/L (ref 101–111)
CO2: 28 mmol/L (ref 22–32)
Calcium: 8.4 mg/dL — ABNORMAL LOW (ref 8.9–10.3)
Creatinine, Ser: 0.66 mg/dL (ref 0.44–1.00)
GFR calc non Af Amer: >60 mL/min (ref >60)
Glucose, Bld: 124 mg/dL — ABNORMAL HIGH (ref 65–99)
Potassium: 4.3 mmol/L (ref 3.5–5.1)
Sodium: 136 mmol/L (ref 135–145)

## 2016-01-16 LAB — HEMOGLOBIN: Hemoglobin: 8.7 g/dL — ABNORMAL LOW (ref 12.0–16.0)

## 2016-01-16 LAB — HEMOGLOBIN A1C: Hgb A1c MFr Bld: 5.4 % (ref 4.0–6.0)

## 2016-01-16 LAB — GLUCOSE, CAPILLARY: Glucose-Capillary: 112 mg/dL — ABNORMAL HIGH (ref 65–99)

## 2016-01-16 MED ORDER — SODIUM CHLORIDE 0.9 % IV SOLN: INTRAVENOUS | Status: DC

## 2016-01-16 MED ORDER — CHLORHEXIDINE GLUCONATE 0.12 % MT SOLN
15.0000 mL | Freq: Two times a day (BID) | OROMUCOSAL | Status: DC
  Administered 2016-01-16 – 2016-01-18 (×4): 15 mL via OROMUCOSAL
  Filled 2016-01-16 (×3): qty 15

## 2016-01-16 MED ORDER — CETYLPYRIDINIUM CHLORIDE 0.05 % MT LIQD
7.0000 mL | Freq: Two times a day (BID) | OROMUCOSAL | Status: DC
  Administered 2016-01-16 – 2016-01-17 (×3): 7 mL via OROMUCOSAL

## 2016-01-16 MED ORDER — IPRATROPIUM-ALBUTEROL 0.5-2.5 (3) MG/3ML IN SOLN: 3.0000 mL | Freq: Once | RESPIRATORY_TRACT | Status: DC | PRN

## 2016-01-16 NOTE — Anesthesia Postprocedure Evaluation (Signed)
Anesthesia Post Note  Patient: Shelby George  Procedure(s) Performed: Procedure(s) (LRB): OPEN REDUCTION INTERNAL FIXATION HIP (Right)  Patient location during evaluation: PACU Anesthesia Type: General Level of consciousness: awake and alert Pain management: pain level controlled Vital Signs Assessment: post-procedure vital signs reviewed and stable Respiratory status: spontaneous breathing, nonlabored ventilation, respiratory function stable and patient connected to nasal cannula oxygen Cardiovascular status: blood pressure returned to baseline and stable Postop Assessment: no signs of nausea or vomiting Anesthetic complications: no    Last Vitals:  Filed Vitals:   01/16/16 0020 01/16/16 0128  BP: 116/44 121/34  Pulse: 81 76  Temp: 36.7 C 37.4 C  Resp: 18 18    Last Pain:  Filed Vitals:   01/16/16 0129  PainSc: 0-No pain                 Cleda MccreedyJoseph K Piscitello

## 2016-01-16 NOTE — Progress Notes (Signed)
Physical Therapy Treatment Patient Details Name: Shelby PimpleMildred E George MRN: [id number] DOB: 1915/11/06 Today's Date: 01/16/2016    History of Present Illness Pt sufferred a fall with a R hip fracture. She underwent R hip ORIF without reported post-op complications. She has sufferred 2 falls in the last 12 months    PT Comments    Elected to perform bed level exercises with patient this afternoon due to maxA+1 for transfers and inability to ambulate this AM. Pt also not excited about second PT session but agrees to complete bed exercises. Pt reports increase R hip pain with all bed exercises on this date. She performs exercises slowly with frequent redirection. Session interrupted intermittently for approximately 15 minutes due to need for bladder scan. Will continue to progress exercises, bed mobility, and transfers in anticipation of pre-gait and gait activities. Pt will benefit from skilled PT services to address deficits in strength, balance, and mobility in order to return to full function at home.    Follow Up Recommendations  SNF     Equipment Recommendations  None recommended by PT    Recommendations for Other Services       Precautions / Restrictions Precautions Precautions: Fall Restrictions Weight Bearing Restrictions: Yes RLE Weight Bearing: Weight bearing as tolerated    Mobility  Bed Mobility Overal bed mobility: Needs Assistance Bed Mobility: Supine to Sit     Supine to sit: Max assist     General bed mobility comments: Deferred currently  Transfers Overall transfer level: Needs assistance   Transfers: Sit to/from Stand Sit to Stand: Max assist            Ambulation/Gait                 Stairs            Wheelchair Mobility    Modified Rankin (Stroke Patients Only)       Balance Overall balance assessment: Needs assistance   Sitting balance-Leahy Scale: Fair (Pt. leans to the left secondary to right hip pain.)       Standing  balance-Leahy Scale: Poor                      Cognition Arousal/Alertness: Awake/alert Behavior During Therapy: WFL for tasks assessed/performed Overall Cognitive Status: No family/caregiver present to determine baseline cognitive functioning       Memory: Decreased short-term memory              Exercises General Exercises - Lower Extremity Ankle Circles/Pumps: Strengthening;Both;Supine;15 reps Quad Sets: Strengthening;Both;10 reps;Supine Short Arc Quad: Strengthening;Right;Supine;15 reps Heel Slides: Strengthening;Right;10 reps;Supine Hip ABduction/ADduction: Strengthening;Right;15 reps;Supine Straight Leg Raises: Strengthening;Right;15 reps;Supine    General Comments        Pertinent Vitals/Pain Pain Assessment: 0-10 Pain Score: 5  Pain Location: R hip Pain Intervention(s): Monitored during session;Limited activity within patient's tolerance    Home Living Family/patient expects to be discharged to:: Skilled nursing facility               Additional Comments: Pt from ALF. Ambulates independently with rollator and cares for all ADLs independently. Assist with IADLs    Prior Function Level of Independence: Needs assistance  Gait / Transfers Assistance Needed: Independent facility ambulatro with rollator ADL's / Homemaking Assistance Needed: [facility]. [facility].      PT Goals (current goals can now be found in the care plan section) Acute Rehab PT Goals Patient Stated  Goal: To return to doing the things she was able to do before. PT Goal Formulation: With patient Time For Goal Achievement: 01/30/16 Potential to Achieve Goals: Good Progress towards PT goals: Progressing toward goals    Frequency  BID    PT Plan Current plan remains appropriate    Co-evaluation             End of Session   Activity Tolerance: Patient limited by pain Patient left: in bed;with call bell/phone  within reach;with bed alarm set;with family/visitor present (Pillows under knees)     Time: 1610-9604 PT Time Calculation (min) (ACUTE ONLY): 31 min  Charges:  $Therapeutic Exercise: 8-22 mins                    G Codes:     Sharalyn Ink Wendee Hata PT, DPT   Margarette Vannatter 01/16/2016, 5:11 PM

## 2016-01-16 NOTE — Evaluation (Signed)
Occupational Therapy Evaluation Patient Details Name: Shelby George MRN: [id number] DOB: 1916/02/23 Today's Date: 01/16/2016    History of Present Illness Pt suffred a fall with a R hip fracture. She underwent R hip ORIF without reported post-op complications. She has sufferred 2 falls in the last 12 months   Clinical Impression    Pt. Is a 82 y.o. Female who was admitted to [facility] with a Right Hip fracture  Who underwent an ORIF. Pt. presents with weakness, limited ROM, decreased activity tolerance, and pain which hinders her ability to complete ADL tasks, and functional mobility. Pt. could benefit from skilled OT services to improve ADL and IADL functioning, and return to her PLOF at ALF level of care.   Follow Up Recommendations  SNF    Equipment Recommendations       Recommendations for Other Services PT consult     Precautions / Restrictions Restrictions Weight Bearing Restrictions: Yes RLE Weight Bearing: Weight bearing as tolerated      Mobility Bed Mobility Overal bed mobility: Needs Assistance Bed Mobility: Supine to Sit     Supine to sit: Max assist        Transfers Overall transfer level: Needs assistance   Transfers: Sit to/from Stand Sit to Stand: Max assist              Balance Overall balance assessment: Needs assistance   Sitting balance-Leahy Scale: Fair (Pt. leans to the left secondary to right hip pain.)       Standing balance-Leahy Scale: Poor                              ADL Overall ADL's : Needs assistance/impaired                 Upper Body Dressing : Minimal assistance Web designer)   Lower Body Dressing: Maximal assistance                       Vision     Perception     Praxis      Pertinent Vitals/Pain Pain Assessment: 0-10 Pain Score: 10-Worst pain ever     Hand Dominance     Extremity/Trunk Assessment Upper Extremity Assessment Upper Extremity Assessment: Overall WFL for  tasks assessed;Generalized weakness           Communication     Cognition Arousal/Alertness: Awake/alert Behavior During Therapy: WFL for tasks assessed/performed Overall Cognitive Status: No family/caregiver present to determine baseline cognitive functioning       Memory: Decreased short-term memory             General Comments       Exercises       Shoulder Instructions      Home Living Family/patient expects to be discharged to:: Skilled nursing facility                                        Prior Functioning/Environment Level of Independence: Needs assistance    ADL's / Homemaking Assistance Needed: Pt. moved into [place] [facility]F very recently from [facility].         OT Diagnosis: Generalized weakness;Cognitive deficits   OT Problem List: Decreased strength;Decreased range of motion;Decreased activity tolerance;Impaired balance (sitting and/or standing);Decreased knowledge of use of DME or AE;Pain   OT Treatment/Interventions:  Self-care/ADL training;Therapeutic exercise;DME and/or AE instruction;Therapeutic activities;Patient/family education;Balance training    OT Goals(Current goals can be found in the care plan section) Acute Rehab OT Goals Patient Stated Goal: To return to doing the things she was able to do before. OT Goal Formulation: With patient/family Time For Goal Achievement: 01/16/16 Potential to Achieve Goals: Good ADL Goals Pt Will Perform Lower Body Dressing: with min assist;with adaptive equipment Pt Will Transfer to Toilet: with mod assist  OT Frequency: Min 1X/week   Barriers to D/C:            Co-evaluation              End of Session Equipment Utilized During Treatment: Gait belt Nurse Communication: Other (comment) (Pt. family request for pt. to have meals set-up.)  Activity Tolerance: Patient tolerated treatment well Patient left: in bed;with call bell/phone within reach;with  bed alarm set;with family/visitor present   Time: 1120-1140 OT Time Calculation (min): 20 min Charges:  OT General Charges $OT Visit: 1 Procedure OT Evaluation $OT Eval High Complexity: 1 Procedure G-Codes:    Olegario MessierElaine Graysen [place], MS, OTR/L  Olegario MessierElaine Tongela Encinas 01/16/2016, 1:47 PM

## 2016-01-16 NOTE — Evaluation (Signed)
Physical Therapy Evaluation Patient Details Name: Shelby George MRN: [id number] DOB: [birth year] Today's Date: 01/16/2016   History of Present Illness  Pt suferred a fall with a R hip fracture. She underwent R hip ORIF without reported post-op complications. She has sufferred 2 falls in the last 12 months  Clinical Impression  Pt with severe RLE weakness and pain which limits mobility as well as motivation for movement. She is able to come to sitting with modA+2 and standing with maxA+1. Unable to ambulate at this time due to RLE weakness and pain. Only able to stay standing for brief time with heavy assist from therapist. Pt will need SNF placement at discharge in order to return to ALF at prior level of function. Pt will benefit from skilled PT services to address deficits in strength, balance, and mobility in order to return to full function at home.     Follow Up Recommendations SNF    Equipment Recommendations  None recommended by PT    Recommendations for Other Services       Precautions / Restrictions Precautions Precautions: Fall Restrictions Weight Bearing Restrictions: Yes RLE Weight Bearing: Weight bearing as tolerated      Mobility  Bed Mobility Overal bed mobility: Needs Assistance Bed Mobility: Supine to Sit     Supine to sit: Max assist Sit to supine: Mod assist;+2 for physical assistance   General bed mobility comments: Pt requires heavy assist for both phases of bed mobility. Poor sequencing and poor UE strength.   Transfers Overall transfer level: Needs assistance Equipment used: None Transfers: Sit to/from Stand Sit to Stand: Max assist         General transfer comment: Pt demonstrates poor ability to shift weight to RLE. She is anxious about transfers at this time so she requires blocking of R knee and therapist lifting from the front. Able to come to standing but unable to remain standing without maxA+1 assist. Unable to ambulate or transfer to  recliner at this time  Ambulation/Gait                Stairs            Wheelchair Mobility    Modified Rankin (Stroke Patients Only)       Balance Overall balance assessment: Needs assistance Sitting-balance support: No upper extremity supported Sitting balance-Leahy Scale: Fair (Pt. leans to the left secondary to right hip pain.)     Standing balance support: Bilateral upper extremity supported Standing balance-Leahy Scale: Poor                               Pertinent Vitals/Pain Pain Assessment: 0-10 Pain Score: 10-Worst pain ever Pain Location: R hip, Pain Intervention(s): Limited activity within patient's tolerance;Monitored during session    Home Living Family/patient expects to be discharged to:: Skilled nursing facility                 Additional Comments: Pt from ALF. Ambulates independently with rollator and cares for all ADLs independently. Assist with IADLs    Prior Function Level of Independence: Needs assistance   Gait / Transfers Assistance Needed: Independent facility ambulatro with rollator  ADL's / Homemaking Assistance Needed: [facility]. [facility].         Hand Dominance   Dominant Hand: Right    Extremity/Trunk Assessment   Upper Extremity Assessment: Overall WFL for tasks  assessed;Generalized weakness           Lower Extremity Assessment: Generalized weakness;RLE deficits/detail RLE Deficits / Details: Pt unable to perform SLR or full LAQ in sitting. Requires assist for R hip adduction       Communication   Communication: No difficulties  Cognition Arousal/Alertness: Awake/alert Behavior During Therapy: WFL for tasks assessed/performed Overall Cognitive Status: No family/caregiver present to determine baseline cognitive functioning       Memory: Decreased short-term memory              General Comments      Exercises         Assessment/Plan    PT Assessment Patient needs continued PT services  PT Diagnosis Difficulty walking;Generalized weakness;Acute pain   PT Problem List Decreased strength;Decreased activity tolerance;Decreased balance;Decreased mobility;Pain  PT Treatment Interventions DME instruction;Gait training;Functional mobility training;Therapeutic exercise;Therapeutic activities;Balance training;Neuromuscular re-education;Patient/family education;Wheelchair mobility training;Manual techniques   PT Goals (Current goals can be found in the Care Plan section) Acute Rehab PT Goals Patient Stated Goal: To return to doing the things she was able to do before. PT Goal Formulation: With patient Time For Goal Achievement: 01/30/16 Potential to Achieve Goals: Good    Frequency BID   Barriers to discharge        Co-evaluation               End of Session Equipment Utilized During Treatment: Gait belt Activity Tolerance: Patient limited by pain Patient left: in bed;with call bell/phone within reach;with bed alarm set;with family/visitor present (Pillows under knees)           Time: 1140-1200 PT Time Calculation (min) (ACUTE ONLY): 20 min   Charges:   PT Evaluation $PT Eval Moderate Complexity: 1 Procedure     PT G Codes:       Sharalyn Ink Oren Barella PT, DPT   Shelby George 01/16/2016, 1:51 PM

## 2016-01-16 NOTE — Progress Notes (Signed)
Bladder scanned patient with return results of . Paged attending, awaiting call back

## 2016-01-16 NOTE — Progress Notes (Signed)
Subjective:  Postop day #1 status post intramedullary fixation for right intertrochanteric hip fracture.  Patient states she is not having pain at rest but has significant pain with right lower extremity movement.  She is seen in her hospital room with her four daughters at the bedside. Patient has just completed physical therapy.   Objective:   VITALS:   Filed Vitals:   01/16/16 0128 01/16/16 0234 01/16/16 0415 01/16/16 0745  BP: 121/34 101/35 108/42 126/41  Pulse: 76 63 82 83  Temp: 99.3 F (37.4 C) 97.9 F (36.6 C) 98 F (36.7 C) 97.6 F (36.4 C)  TempSrc: Axillary Oral Oral Oral  Resp: 18 18 18 18   Height:      Weight:      SpO2: 100% 95% 92% 95%    PHYSICAL EXAM:  Right lower extremity: Patient has a clean hip spica dressing on without drainage. Her thigh compartments are soft and compressible. Distally she has intact sensation to light touch in palpable pedal pulses. She can flex and extend her toes and dorsiflex and plantarflex her ankle.   LABS  Results for orders placed or performed during the hospital encounter of 01/14/16 (from the past 24 hour(s))  Glucose, capillary     Status: Abnormal   Collection Time: 01/15/16 10:07 PM  Result Value Ref Range   Glucose-Capillary 162 (H) 65 - 99 mg/dL  Creatinine, serum     Status: None   Collection Time: 01/15/16 10:32 PM  Result Value Ref Range   Creatinine, Ser 0.58 0.44 - 1.00 mg/dL   GFR calc non Af Amer >60 >60 mL/min   GFR calc Af Amer >60 >60 mL/min  CBC     Status: Abnormal   Collection Time: 01/15/16 10:32 PM  Result Value Ref Range   WBC 14.7 (H) 3.6 - 11.0 K/uL   RBC 3.36 (L) 3.80 - 5.20 MIL/uL   Hemoglobin 10.4 (L) 12.0 - 16.0 g/dL   HCT 16.131.6 (L) 09.635.0 - 04.547.0 %   MCV 94.0 80.0 - 100.0 fL   MCH 30.9 26.0 - 34.0 pg   MCHC 32.9 32.0 - 36.0 g/dL   RDW 40.915.0 (H) 81.111.5 - 91.414.5 %   Platelets 142 (L) 150 - 440 K/uL  CBC     Status: Abnormal   Collection Time: 01/16/16  6:06 AM  Result Value Ref Range   WBC  10.7 3.6 - 11.0 K/uL   RBC 2.80 (L) 3.80 - 5.20 MIL/uL   Hemoglobin 8.7 (L) 12.0 - 16.0 g/dL   HCT 78.225.9 (L) 95.635.0 - 21.347.0 %   MCV 92.8 80.0 - 100.0 fL   MCH 31.2 26.0 - 34.0 pg   MCHC 33.7 32.0 - 36.0 g/dL   RDW 08.614.7 (H) 57.811.5 - 46.914.5 %   Platelets 111 (L) 150 - 440 K/uL  Basic metabolic panel     Status: Abnormal   Collection Time: 01/16/16  6:06 AM  Result Value Ref Range   Sodium 136 135 - 145 mmol/L   Potassium 4.3 3.5 - 5.1 mmol/L   Chloride 108 101 - 111 mmol/L   CO2 28 22 - 32 mmol/L   Glucose, Bld 124 (H) 65 - 99 mg/dL   BUN 18 6 - 20 mg/dL   Creatinine, Ser 6.290.66 0.44 - 1.00 mg/dL   Calcium 8.4 (L) 8.9 - 10.3 mg/dL   GFR calc non Af Amer >60 >60 mL/min   GFR calc Af Amer >60 >60 mL/min   Anion gap 0 (L) 5 -  15  Glucose, capillary     Status: Abnormal   Collection Time: 01/16/16  7:47 AM  Result Value Ref Range   Glucose-Capillary 112 (H) 65 - 99 mg/dL    Dg Tibia/fibula Right  01/15/2016  CLINICAL DATA:  Recheck fibular fracture EXAM: RIGHT TIBIA AND FIBULA - 2 VIEW COMPARISON:  01/14/2016, 12/07/2015 FINDINGS: There is a nondisplaced anatomically aligned diaphyseal fracture of the fibula just distal to the mid point with solid bridging callus. There is prior plate screw fixation of the distal fibula and 2 medial malleolar screws. IMPRESSION: Fibular diaphyseal fracture is healing in good alignment and position, with solid bridging callus. Electronically Signed   By: Ellery Plunk M.D.   On: 01/15/2016 21:23   Ct Head Wo Contrast  01/15/2016  CLINICAL DATA:  Missed a step walking down stairs, falling to a tile floor. EXAM: CT HEAD WITHOUT CONTRAST TECHNIQUE: Contiguous axial images were obtained from the base of the skull through the vertex without intravenous contrast. COMPARISON:  None. FINDINGS: There is no intracranial hemorrhage or extra-axial fluid collection. There is moderate generalized atrophy. There is white matter hypodensity which is likely chronic and due to  small vessel disease. Focally prominent hypodensity in the left frontal white matter may represent remote infarction. There probably also is remote lacunar infarction in the left pons. Calvarium and skullbase are intact. Orbits are intact. IMPRESSION: Negative for acute intracranial traumatic injury. There is generalized atrophy, chronic white matter hypodensity and remote lacunar infarctions. Electronically Signed   By: Ellery Plunk M.D.   On: 01/15/2016 00:31   Dg Chest Port 1 View  01/15/2016  CLINICAL DATA:  Larey Seat on tile floor and bathroom. History of pulmonary arterial hypertension, compression fractures. EXAM: PORTABLE CHEST 1 VIEW COMPARISON:  Chest radiograph November 20, 2014 FINDINGS: The cardiac silhouette is mildly enlarged, unchanged. Calcified aortic knob. Increased lung volumes compatible with COPD without pleural effusion or focal consolidation. Multilevel thoracolumbar vertebral body cement augmentation. Patient's osteopenic. Healing RIGHT lateral rib fractures with callus. Old LEFT lateral rib fractures. IMPRESSION: Stable cardiomegaly and COPD. Electronically Signed   By: Awilda Metro M.D.   On: 01/15/2016 00:22   Dg C-arm 1-60 Min  01/15/2016  CLINICAL DATA:  Right basicervical femur fracture EXAM: DG C-ARM 61-120 MIN; RIGHT FEMUR 2 VIEWS COMPARISON:  January 14, 2016 FINDINGS: Frontal and lateral views were obtained. There is screw and rod fixation through a fracture of the basicervical proximal femur region. Alignment at the fracture site is anatomic. The tip of the screws in the proximal femoral head. No dislocation. IMPRESSION: Screw and plate fixation with alignment anatomic in the proximal femoral region. Tip of screw in proximal femoral head. No dislocation. Electronically Signed   By: Bretta Bang III M.D.   On: 01/15/2016 20:46   Dg Hip Unilat With Pelvis 2-3 Views Right  01/15/2016  CLINICAL DATA:  Larey Seat onto tile floor while trying to walk to the bathroom. EXAM: DG  HIP (WITH OR WITHOUT PELVIS) 2-3V RIGHT COMPARISON:  12/07/2015 FINDINGS: There is an acute intertrochanteric right hip fracture with varus angulation. No dislocation. No bone lesion or bony destruction. No other acute findings are evident IMPRESSION: Intertrochanteric right hip fracture. Electronically Signed   By: Ellery Plunk M.D.   On: 01/15/2016 00:21   Dg Femur, Min 2 Views Right  01/15/2016  CLINICAL DATA:  Right basicervical femur fracture EXAM: DG C-ARM 61-120 MIN; RIGHT FEMUR 2 VIEWS COMPARISON:  January 14, 2016 FINDINGS: Frontal and lateral views  were obtained. There is screw and rod fixation through a fracture of the basicervical proximal femur region. Alignment at the fracture site is anatomic. The tip of the screws in the proximal femoral head. No dislocation. IMPRESSION: Screw and plate fixation with alignment anatomic in the proximal femoral region. Tip of screw in proximal femoral head. No dislocation. Electronically Signed   By: Bretta Bang III M.D.   On: 01/15/2016 20:46   Dg Femur Port, Min 2 Views Right  01/15/2016  CLINICAL DATA:  Status post fixation of proximal femur fracture EXAM: RIGHT FEMUR PORTABLE 1 VIEW COMPARISON:  Intraoperative images obtained earlier in the day FINDINGS: Frontal and lateral view show rod and screw fixation through a proximal femur fracture with alignment anatomic. Tip of the screws in the proximal femoral head. No dislocation. No new fracture. IMPRESSION: Alignment at the proximal femur fracture site is anatomic. Tip of screw in proximal femoral head. No dislocation. Electronically Signed   By: Bretta Bang III M.D.   On: 01/15/2016 21:22    Assessment/Plan: 1 Day Post-Op   Active Problems:   Hip fracture [facility])  Patient is doing well postop. She has an expected decrease in her hemoglobin and hematocrit following the right hip fracture and subsequent surgery. Her vital signs remained stable. I reviewed the postoperative x-rays which show  the fracture is well reduced and the hardware well positioned. Her right tibia and fibula films demonstrate abundant callus formation at the mid shaft fibular fracture. She has previous hardware in her right ankle for bimalleolar fracture. Patient will continue with physical and occupational therapy. She is due to have a speech and swallow consultation. She will start Lovenox for DVT prophylaxis along with TED stockings and foot pumps.  Patient should use incentive spirometry while awake. She will complete 24 hours of postop antibiotics.     Juanell Fairly , MD 01/16/2016, 12:20 PM

## 2016-01-16 NOTE — Progress Notes (Signed)
[facility] [place] at [facility]   PATIENT NAME: Shelby George    MR#:  [id number]  DATE OF BIRTH:  1916/05/27  SUBJECTIVE:  CHIEF COMPLAINT:   Chief Complaint  Patient presents with  . Fall   - right hip fracture, POD #1 today, hb dropped, BP low - patient pleasantly confused, awaiting speech eval as NPO- coughed with sip of water this morning  REVIEW OF SYSTEMS:  Review of Systems  Constitutional: Negative for fever and chills.  HENT: Positive for hearing loss. Negative for ear discharge, ear pain and nosebleeds.   Eyes: Negative for blurred vision and double vision.  Respiratory: Negative for cough, shortness of breath and wheezing.   Cardiovascular: Negative for chest pain, palpitations and leg swelling.  Gastrointestinal: Negative for nausea, vomiting, abdominal pain, diarrhea and constipation.  Genitourinary: Negative for dysuria.  Musculoskeletal: Positive for joint pain.  Neurological: Negative for dizziness, speech change, focal weakness, seizures and headaches.  Psychiatric/Behavioral: Negative for depression.       Very confused    DRUG ALLERGIES:   Allergies  Allergen Reactions  . Alendronate Sodium Other (See Comments)    Reaction:  Unknown   . Celebrex [Celecoxib] Other (See Comments)    Reaction:  Unknown     VITALS:  Blood pressure 126/41, pulse 83, temperature 97.6 F (36.4 C), temperature source Oral, resp. rate 18, height  (1.626 m), weight 58.151 kg (128 lb 3.2 oz), SpO2 95 %.  PHYSICAL EXAMINATION:  Physical Exam  GENERAL:  82 y.o.-year-old patient lying in the bed with no acute distress.  EYES: Pupils equal, round, reactive to light and accommodation. No scleral icterus. Extraocular muscles intact.  HEENT: Head atraumatic, normocephalic. Oropharynx and nasopharynx clear.  NECK:  Supple, no jugular venous distention. No thyroid enlargement, no tenderness.  LUNGS: Normal breath sounds bilaterally, no wheezing,  rales,rhonchi or crepitation. No use of accessory muscles of respiration. Decreased bibasilar breath sounds noted CARDIOVASCULAR: S1, S2 normal. No  rubs, or gallops. 2/6 systolic murmur is present ABDOMEN: Soft, nontender, nondistended. Bowel sounds present. No organomegaly or mass.  EXTREMITIES: No pedal edema, cyanosis, or clubbing. Right leg surgery done, ACE wrap noted all along her right hip on to the lower abdomen NEUROLOGIC: Cranial nerves II through XII are intact. Following commands. Muscle strength is equal in all extremities except right leg which is limited due to the fracture. Sensation intact. Gait not checked.  PSYCHIATRIC: The patient is alert and oriented to self. SKIN: No obvious rash, lesion, or ulcer.    LABORATORY PANEL:   CBC  Recent Labs Lab 01/16/16 0606  WBC 10.7  HGB 8.7*  HCT 25.9*  PLT 111*   ------------------------------------------------------------------------------------------------------------------  Chemistries   Recent Labs Lab 01/16/16 0606  NA 136  K 4.3  CL 108  CO2 28  GLUCOSE 124*  BUN 18  CREATININE 0.66  CALCIUM 8.4*   ------------------------------------------------------------------------------------------------------------------  Cardiac Enzymes  Recent Labs Lab 01/14/16 2325  TROPONINI <0.03   ------------------------------------------------------------------------------------------------------------------  RADIOLOGY:  Dg Tibia/fibula Right  01/15/2016  CLINICAL DATA:  Recheck fibular fracture EXAM: RIGHT TIBIA AND FIBULA - 2 VIEW COMPARISON:  01/14/2016, 12/07/2015 FINDINGS: There is a nondisplaced anatomically aligned diaphyseal fracture of the fibula just distal to the mid point with solid bridging callus. There is prior plate screw fixation of the distal fibula and 2 medial malleolar screws. IMPRESSION: Fibular diaphyseal fracture is healing in good alignment and position, with solid bridging callus. Electronically  Signed   By:  Ellery Plunk M.D.   On: 01/15/2016 21:23   Ct Head Wo Contrast  01/15/2016  CLINICAL DATA:  Missed a step walking down stairs, falling to a tile floor. EXAM: CT HEAD WITHOUT CONTRAST TECHNIQUE: Contiguous axial images were obtained from the base of the skull through the vertex without intravenous contrast. COMPARISON:  None. FINDINGS: There is no intracranial hemorrhage or extra-axial fluid collection. There is moderate generalized atrophy. There is white matter hypodensity which is likely chronic and due to small vessel disease. Focally prominent hypodensity in the left frontal white matter may represent remote infarction. There probably also is remote lacunar infarction in the left pons. Calvarium and skullbase are intact. Orbits are intact. IMPRESSION: Negative for acute intracranial traumatic injury. There is generalized atrophy, chronic white matter hypodensity and remote lacunar infarctions. Electronically Signed   By: Ellery Plunk M.D.   On: 01/15/2016 00:31   Dg Chest Port 1 View  01/15/2016  CLINICAL DATA:  Larey Seat on tile floor and bathroom. History of pulmonary arterial hypertension, compression fractures. EXAM: PORTABLE CHEST 1 VIEW COMPARISON:  Chest radiograph November 20, 2014 FINDINGS: The cardiac silhouette is mildly enlarged, unchanged. Calcified aortic knob. Increased lung volumes compatible with COPD without pleural effusion or focal consolidation. Multilevel thoracolumbar vertebral body cement augmentation. Patient's osteopenic. Healing RIGHT lateral rib fractures with callus. Old LEFT lateral rib fractures. IMPRESSION: Stable cardiomegaly and COPD. Electronically Signed   By: Awilda Metro M.D.   On: 01/15/2016 00:22   Dg C-arm 1-60 Min  01/15/2016  CLINICAL DATA:  Right basicervical femur fracture EXAM: DG C-ARM 61-120 MIN; RIGHT FEMUR 2 VIEWS COMPARISON:  January 14, 2016 FINDINGS: Frontal and lateral views were obtained. There is screw and rod fixation through  a fracture of the basicervical proximal femur region. Alignment at the fracture site is anatomic. The tip of the screws in the proximal femoral head. No dislocation. IMPRESSION: Screw and plate fixation with alignment anatomic in the proximal femoral region. Tip of screw in proximal femoral head. No dislocation. Electronically Signed   By: Bretta Bang III M.D.   On: 01/15/2016 20:46   Dg Hip Unilat With Pelvis 2-3 Views Right  01/15/2016  CLINICAL DATA:  Larey Seat onto tile floor while trying to walk to the bathroom. EXAM: DG HIP (WITH OR WITHOUT PELVIS) 2-3V RIGHT COMPARISON:  12/07/2015 FINDINGS: There is an acute intertrochanteric right hip fracture with varus angulation. No dislocation. No bone lesion or bony destruction. No other acute findings are evident IMPRESSION: Intertrochanteric right hip fracture. Electronically Signed   By: Ellery Plunk M.D.   On: 01/15/2016 00:21   Dg Femur, Min 2 Views Right  01/15/2016  CLINICAL DATA:  Right basicervical femur fracture EXAM: DG C-ARM 61-120 MIN; RIGHT FEMUR 2 VIEWS COMPARISON:  January 14, 2016 FINDINGS: Frontal and lateral views were obtained. There is screw and rod fixation through a fracture of the basicervical proximal femur region. Alignment at the fracture site is anatomic. The tip of the screws in the proximal femoral head. No dislocation. IMPRESSION: Screw and plate fixation with alignment anatomic in the proximal femoral region. Tip of screw in proximal femoral head. No dislocation. Electronically Signed   By: Bretta Bang III M.D.   On: 01/15/2016 20:46   Dg Femur Port, Min 2 Views Right  01/15/2016  CLINICAL DATA:  Status post fixation of proximal femur fracture EXAM: RIGHT FEMUR PORTABLE 1 VIEW COMPARISON:  Intraoperative images obtained earlier in the day FINDINGS: Frontal and lateral view show rod  and screw fixation through a proximal femur fracture with alignment anatomic. Tip of the screws in the proximal femoral head. No  dislocation. No new fracture. IMPRESSION: Alignment at the proximal femur fracture site is anatomic. Tip of screw in proximal femoral head. No dislocation. Electronically Signed   By: Bretta BangWilliam  Woodruff III M.D.   On: 01/15/2016 21:22    EKG:   Orders placed or performed during the hospital encounter of 01/14/16  . ED EKG  . ED EKG  . EKG 12-Lead  . EKG 12-Lead    ASSESSMENT AND PLAN:   82 year old female with past medical history significant for osteoporosis, pulmonary hypertension, systemic hypertension, mild dementia presents to the hospital after a fall and noted to have right hip fracture.  #1 Right hip fracture-appreciate orthopedics consult. POD #1 -Pain control. More confused now -Postoperative DVT prophylaxis and physical therapy consults. -Seen by admitting physician and deemed to be moderate to high risk for surgery. Family aware. - Hemoglobin dropped after surgery  #2 Hypotension- post operatively low BP- hold Imdur and metoprolol today  #3 COPD- continue her inhalers  #4 Acute post op anemia- hb dropped from 10.4 to 8.7 Continue to monitor and transfuse if hemoglobin less than 8. Iron supplement started  #5 Mild dementia-continue Galantamine  #6 DVT Prophylaxis-  Lovenox started after surgery   Speech Therapy consult today. Physical therapy consulted   All the records are reviewed and case discussed with Care Management/Social Workerr. Management plans discussed with the patient, family and they are in agreement.  CODE STATUS: Full code  TOTAL TIME TAKING CARE OF THIS PATIENT: 37 minutes.   POSSIBLE D/C IN 1-2 DAYS, DEPENDING ON CLINICAL CONDITION.   Enid BaasKALISETTI,Dailynn Nancarrow M.D on 01/16/2016 at 11:45 AM  Between 7am to 6pm - Pager - [phone]  After 6pm go to [email or web address] - password [email or web address] [facility]  [facility]  Office  [phone]  CC: Primary care physician; Danella PentonMark F Miller, MD

## 2016-01-16 NOTE — Progress Notes (Signed)
ANTICOAGULATION CONSULT NOTE -Follow up Consult  Pharmacy Consult for enoxaparin Indication: VTE prophylaxis  Allergies  Allergen Reactions  . Alendronate Sodium Other (See Comments)    Reaction:  Unknown   . Celebrex [Celecoxib] Other (See Comments)    Reaction:  Unknown     Patient Measurements: Height: 5\' 4"  (162.6 cm) Weight: 128 lb 3.2 oz (58.151 kg) IBW/kg (Calculated) : 54.7 Heparin Dosing Weight:   Vital Signs: Temp: 97.6 F (36.4 C) (04/12 0745) Temp Source: Oral (04/12 0745) BP: 126/41 mmHg (04/12 0745) Pulse Rate: 83 (04/12 0745)  Labs:  Recent Labs  01/14/16 2325 01/15/16 2232 01/16/16 0606  HGB 12.7 10.4* 8.7*  HCT 37.9 31.6* 25.9*  PLT 174 142* 111*  CREATININE 0.79 0.58 0.66  TROPONINI <0.03  --   --     Estimated Creatinine Clearance: 34.7 mL/min (by C-G formula based on Cr of 0.66).   Medical History: Past Medical History  Diagnosis Date  . Hypothyroidism   . Lumbar compression fracture (HCC)   . Osteoporosis   . Pulmonary hypertension (HCC)   . Systemic primary arterial hypertension   . Sleep apnea   . Mild dementia     Medications:  Infusions:  . sodium chloride 75 mL/hr (01/16/16 1223)  . dextrose 5 % and 0.9% NaCl 100 mL/hr at 01/15/16 1500    Assessment: 89 yof cc fall/hip fracture. Surgeon entered Lovenox 40 mg subcutaneously every 24 hours starting at 0800 on 01/16/16. That dose is appropriate for her BMI and CrCl. Pharmacy will continue to monitor labs per protocol.   Goal of Therapy:  Monitor platelets by anticoagulation protocol: Yes   Plan:  Lovenox 40 mg subcutaneously once daily starting at 0800 on 01/16/2016.  4/12: Hgb dropped to 8.7. Patient s/p hip surgery 4/11. MD aware. Plan to transfuse if HGB < 8.0. Will continue Lovenox currently and follow.  Dearra Myhand A, Pharm.D., BCPS Clinical Pharmacist 01/16/2016,3:06 PM

## 2016-01-16 NOTE — Evaluation (Signed)
Clinical/Bedside Swallow Evaluation Patient Details  Name: Shelby George MRN: [id number] Date of Birth: 1916-07-13  Today's Date: 01/16/2016 Time: SLP Start Time (ACUTE ONLY): 1200 SLP Stop Time (ACUTE ONLY): 1300 SLP Time Calculation (min) (ACUTE ONLY): 60 min  Past Medical History:  Past Medical History  Diagnosis Date  . Hypothyroidism   . Lumbar compression fracture (HCC)   . Osteoporosis   . Pulmonary hypertension (HCC)   . Systemic primary arterial hypertension   . Sleep apnea   . Mild dementia    Past Surgical History:  Past Surgical History  Procedure Laterality Date  . Kyphosis surgery    . Ankle fracture surgery      R ankle  . Orif hip fracture Right 01/15/2016    Procedure: OPEN REDUCTION INTERNAL FIXATION HIP;  Surgeon: Juanell FairlyKevin Krasinski, MD;  Location: [facility] ORS;  Service: Orthopedics;  Laterality: Right;   HPI:  patient is a 82 y/o female who presents to the emergency department after suffering a mechanical fall as she attempted to ambulate to the restroom. She hit her head but did not lose consciousness. She denies chest pain, shortness of breath, nausea, vomiting or diarrhea. She has fallen multiple times in the past and suffered other orthopedic injuries secondary to her osteoporosis. She was unable to stand after her fall and was found to have an intertrochanteric fracture of her right hip. Pt has had previous orthopedic injuries, dxs of Mild Dementia, HTN, sleep apnea; appears HOH. Pt was eating an oral diet per MD order but had a choking/coughing event this morning when drinking water w/ NSG. PO diet on hold until evaluation.    Assessment / Plan / Recommendation Clinical Impression  Pt appeared to adequately tolerate trials of thin liquids and purees/soft solids w/ no overt coughing noted; min. throat clearing x1 noted w/ initial trial of thin liquids. Pt was educated on taking smaller, single sips of thin liquids via cup or straw to have better bolus management  during the oral and pharyngeal phases of swallowing. No further incidents of dysphagia noted w/ thin liquids. Pt appeared to tolerate trials of purees and mech soft w/ no overt s/s of aspiration and no oral phase deficits noted. Pt required min. assistance w/ feeding sec. to fatigue and mild confusion/distraction to tasks. Dtrs. present and educated on food prep and on feeding support strategies to reduce risk for aspiration to include taking small amounts at a time and to reduce distractions/talking during po intake. Education on other aspiration precautions; strategies; drink and food options given as well as rec. for all Meds to be given in Puree.     Aspiration Risk  Mild aspiration risk (d/t advanced age w/ fall and surgery)    Diet Recommendation  Dys. 3 w/ thin liquids; strict aspiration precautions to include reducing distractions during meals; feeding assistance   Medication Administration: Crushed with puree (whole as able to tolerate)    Other  Recommendations Recommended Consults:  (Dietician consult for supplement support) Oral Care Recommendations: Oral care BID;Staff/trained caregiver to provide oral care   Follow up Recommendations   (TBD)    Frequency and Duration min 3x week  2 weeks       Prognosis Prognosis for Safe Diet Advancement: Good Barriers to Reach Goals: Cognitive deficits      Swallow Study   General Date of Onset: 01/14/16 HPI: patient is a 82 y/o female who presents to the emergency department after suffering a mechanical fall as she attempted to ambulate  to the restroom. She hit her head but did not lose consciousness. She denies chest pain, shortness of breath, nausea, vomiting or diarrhea. She has fallen multiple times in the past and suffered other orthopedic injuries secondary to her osteoporosis. She was unable to stand after her fall and was found to have an intertrochanteric fracture of her right hip. Pt has had previous orthopedic injuries, dxs of  Mild Dementia, HTN, sleep apnea; appears HOH. Pt was eating an oral diet per MD order but had a choking/coughing event this morning when drinking water w/ NSG. PO diet on hold until evaluation.  Type of Study: Bedside Swallow Evaluation Previous Swallow Assessment: none Diet Prior to this Study: Regular;Thin liquids (at home prior to this hospitalization per Dtrs.) Temperature Spikes Noted: No (wbc 10.7) Respiratory Status: Nasal cannula (2-6 liters previously w/ surgery) History of Recent Intubation: No Behavior/Cognition: Alert;Confused;Pleasant mood;Cooperative;Distractible;Requires cueing (min. HOH) Oral Cavity Assessment: Within Functional Limits Oral Care Completed by SLP: Recent completion by staff Oral Cavity - Dentition: Adequate natural dentition Vision: Functional for self-feeding Self-Feeding Abilities: Able to feed self;Needs assist;Needs set up Patient Positioning: Upright in bed (mild discomfort w/ hip when moving only) Baseline Vocal Quality: Normal Volitional Cough: Strong Volitional Swallow: Able to elicit    Oral/Motor/Sensory Function Overall Oral Motor/Sensory Function: Within functional limits   Ice Chips Ice chips: Within functional limits Presentation: Spoon (fed; 2 trials)   Thin Liquid Thin Liquid: Impaired Presentation: Self Fed;Cup;Straw (assisted w/ feeding/holding cup; 4-5 trials w/ each) Oral Phase Impairments:  (none) Oral Phase Functional Implications:  (none) Pharyngeal  Phase Impairments: Suspected delayed Swallow;Throat Clearing - Delayed (min.; x1 w/ initial trial)    Nectar Thick Nectar Thick Liquid: Not tested   Honey Thick Honey Thick Liquid: Not tested   Puree Puree: Within functional limits Presentation: Self Fed;Spoon (3-4 trials)   Solid   GO   Solid: Within functional limits Presentation: Self Fed (2-3 trials)       Jerilynn Som, MS, CCC-SLP  Ameer Sanden 01/16/2016,5:33 PM

## 2016-01-16 NOTE — Progress Notes (Signed)
Clinical Education officer, museum (CSW) met with patient and her daughter Stanton Kidney to present bed offers. Patient chose [facility]. Doug admissions coordinator at [facility] is aware of accepted bed offer. CSW will continue to follow and assist as needed.   Blima Rich, LCSW [phone]

## 2016-01-17 LAB — BASIC METABOLIC PANEL
Anion gap: 2 — ABNORMAL LOW (ref 5–15)
BUN: 22 mg/dL — AB (ref 6–20)
CHLORIDE: 112 mmol/L — AB (ref 101–111)
CO2: 24 mmol/L (ref 22–32)
CREATININE: 0.6 mg/dL (ref 0.44–1.00)
Calcium: 8.5 mg/dL — ABNORMAL LOW (ref 8.9–10.3)
GFR calc Af Amer: >60 mL/min (ref >60)
GFR calc non Af Amer: >60 mL/min (ref >60)
GLUCOSE: 119 mg/dL — AB (ref 65–99)
POTASSIUM: 3.9 mmol/L (ref 3.5–5.1)
Sodium: 138 mmol/L (ref 135–145)

## 2016-01-17 LAB — CBC
HEMATOCRIT: 23.1 % — AB (ref 35.0–47.0)
HEMOGLOBIN: 7.9 g/dL — AB (ref 12.0–16.0)
MCH: 31.7 pg (ref 26.0–34.0)
MCHC: 34.1 g/dL (ref 32.0–36.0)
MCV: 93 fL (ref 80.0–100.0)
Platelets: 102 10*3/uL — ABNORMAL LOW (ref 150–440)
RBC: 2.48 MIL/uL — AB (ref 3.80–5.20)
RDW: 14.7 % — ABNORMAL HIGH (ref 11.5–14.5)
WBC: 9.5 10*3/uL (ref 3.6–11.0)

## 2016-01-17 LAB — PREPARE RBC (CROSSMATCH)

## 2016-01-17 LAB — ABO/RH: ABO/RH(D): O POS

## 2016-01-17 MED ORDER — POLYETHYLENE GLYCOL 3350 17 G PO PACK
17.0000 g | PACK | Freq: Every day | ORAL | Status: DC
  Administered 2016-01-17 – 2016-01-18 (×2): 17 g via ORAL
  Filled 2016-01-17: qty 1

## 2016-01-17 MED ORDER — SODIUM CHLORIDE 0.9 % IV SOLN
Freq: Once | INTRAVENOUS | Status: AC
  Administered 2016-01-17: 12:00:00 via INTRAVENOUS

## 2016-01-17 MED ORDER — ACETAMINOPHEN 325 MG PO TABS
650.0000 mg | ORAL_TABLET | Freq: Once | ORAL | Status: AC
  Administered 2016-01-17: 650 mg via ORAL
  Filled 2016-01-17: qty 2

## 2016-01-17 MED ORDER — DOCUSATE SODIUM 100 MG PO CAPS
100.0000 mg | ORAL_CAPSULE | Freq: Two times a day (BID) | ORAL | Status: DC
  Administered 2016-01-17 – 2016-01-18 (×3): 100 mg via ORAL
  Filled 2016-01-17 (×3): qty 1

## 2016-01-17 NOTE — Progress Notes (Signed)
Speech Language Pathology Treatment: Dysphagia  Patient Details Name: Shelby George MRN: [id number] DOB: Nov 09, 1915 Today's Date: 01/17/2016 Time: [id number] SLP Time Calculation (min) (ACUTE ONLY): 40 min  Assessment / Plan / Recommendation Clinical Impression  Pt (and Dtr) seen for education w/ aspiration precautions and toleration of diet. Pt presented w/ increased drowsiness and appeared fatigued when attempting breakfast meal but was able to sip on thin liquids w/ support to gain hydration and calories (w/ Boost drink too). Pt appeared to adequately tolerate trials of thin liquids w/ no overt coughing noted; min. throat clearing x1 as is baseline at home per Dtr's report. No decline in respiratory status was noted during/post sips of thin liquids. Pt was given support w/ holding the cup during drinking; encouraged to take small, single sips of thin liquids via straw to have better bolus management during the oral and pharyngeal phases of swallowing. Dtr stated pt was extremely tired "from yesterday" and was going to receive a transfusion today. Discussion w/ Dtr on the various issues that can increase fatigue thus increase risk for aspiration w/ oral intake.  Pt appears able to tolerate her current Dys. 3 diet w/ thin liquids but requires assistance w/ feeding sec. to fatigue and mild confusion/distraction to tasks. Dtr. present and educated on food prep and on feeding support strategies to reduce risk for aspiration to include taking small amounts at a time and to reduce distractions/talking during po intake. Education on other aspiration precautions; strategies; drink and food options given as well as rec. for all Meds to be given in Puree. ST will be available for any further education while admitted.    HPI HPI: patient is a 82 y/o female who presents to the emergency department after suffering a mechanical fall as she attempted to ambulate to the restroom. She hit her head but did not lose  consciousness. She denies chest pain, shortness of breath, nausea, vomiting or diarrhea. She has fallen multiple times in the past and suffered other orthopedic injuries secondary to her osteoporosis. She was unable to stand after her fall and was found to have an intertrochanteric fracture of her right hip. Pt has had previous orthopedic injuries, dxs of Mild Dementia, HTN, sleep apnea; appears HOH. Pt was eating an oral diet per MD order but had a choking/coughing event this morning when drinking water w/ NSG. PO diet on hold until evaluation.       SLP Plan  Continue with current plan of care     Recommendations  Diet recommendations: Dysphagia 3 (mechanical soft);Thin liquid Liquids provided via: Cup;Straw Medication Administration: Whole meds with puree Supervision: Staff to assist with self feeding;Full supervision/cueing for compensatory strategies Compensations: Minimize environmental distractions;Slow rate;Small sips/bites;Follow solids with liquid (rest breaks when nec. ) Postural Changes and/or Swallow Maneuvers: Seated upright 90 degrees;Upright 30-60 min after meal             General recommendations:  (Dietician consult if nec. ) Oral Care Recommendations: Oral care BID;Staff/trained caregiver to provide oral care Follow up Recommendations: None (TBD) Plan: Continue with current plan of care     GO               Shelby SomKatherine Chinedum Vanhouten, MS, CCC-SLP  Shelby George 01/17/2016, 3:40 PM

## 2016-01-17 NOTE — Progress Notes (Signed)
Subjective:  POD #2 s/p intramedullary fixation for right intertrochanteric hip fracture. Patient reports pain as mild.  She is sitting up in bed eating breakfast.  Objective:   VITALS:   Filed Vitals:   01/16/16 0745 01/16/16 1949 01/17/16 0344 01/17/16 0823  BP: 126/41 119/42 122/44 141/48  Pulse: 83 101 88 92  Temp: 97.6 F (36.4 C) 100.1 F (37.8 C) 98.4 F (36.9 C) 97.9 F (36.6 C)  TempSrc: Oral Oral Oral Oral  Resp: 18 18 17 18   Height:      Weight:      SpO2: 95% 99% 98% 97%    PHYSICAL EXAM:  Right lower extremity: Patient is awake and responsive. She has intact sensation light touch throughout the right lower extremity. Her thigh and leg compartments are soft and compressible. She has palpable pedal pulses. She can flex and extend her toes and dorsiflex and plantarflex her ankle.  Patient's spica dressing remains clean dry and intact. Distally she has scant amount of dried sanguinous drainage from surgery.   LABS  Results for orders placed or performed during the hospital encounter of 01/14/16 (from the past 24 hour(s))  Hemoglobin     Status: Abnormal   Collection Time: 01/16/16  5:32 PM  Result Value Ref Range   Hemoglobin 8.7 (L) 12.0 - 16.0 g/dL  CBC     Status: Abnormal   Collection Time: 01/17/16  6:09 AM  Result Value Ref Range   WBC 9.5 3.6 - 11.0 K/uL   RBC 2.48 (L) 3.80 - 5.20 MIL/uL   Hemoglobin 7.9 (L) 12.0 - 16.0 g/dL   HCT 16.123.1 (L) 09.635.0 - 04.547.0 %   MCV 93.0 80.0 - 100.0 fL   MCH 31.7 26.0 - 34.0 pg   MCHC 34.1 32.0 - 36.0 g/dL   RDW 40.914.7 (H) 81.111.5 - 91.414.5 %   Platelets 102 (L) 150 - 440 K/uL  Basic metabolic panel     Status: Abnormal   Collection Time: 01/17/16  6:09 AM  Result Value Ref Range   Sodium 138 135 - 145 mmol/L   Potassium 3.9 3.5 - 5.1 mmol/L   Chloride 112 (H) 101 - 111 mmol/L   CO2 24 22 - 32 mmol/L   Glucose, Bld 119 (H) 65 - 99 mg/dL   BUN 22 (H) 6 - 20 mg/dL   Creatinine, Ser 7.820.60 0.44 - 1.00 mg/dL   Calcium 8.5 (L)  8.9 - 10.3 mg/dL   GFR calc non Af Amer >60 >60 mL/min   GFR calc Af Amer >60 >60 mL/min   Anion gap 2 (L) 5 - 15    Dg Tibia/fibula Right  01/15/2016  CLINICAL DATA:  Recheck fibular fracture EXAM: RIGHT TIBIA AND FIBULA - 2 VIEW COMPARISON:  01/14/2016, 12/07/2015 FINDINGS: There is a nondisplaced anatomically aligned diaphyseal fracture of the fibula just distal to the mid point with solid bridging callus. There is prior plate screw fixation of the distal fibula and 2 medial malleolar screws. IMPRESSION: Fibular diaphyseal fracture is healing in good alignment and position, with solid bridging callus. Electronically Signed   By: Ellery Plunkaniel R Mitchell M.D.   On: 01/15/2016 21:23   Dg C-arm 1-60 Min  01/15/2016  CLINICAL DATA:  Right basicervical femur fracture EXAM: DG C-ARM 61-120 MIN; RIGHT FEMUR 2 VIEWS COMPARISON:  January 14, 2016 FINDINGS: Frontal and lateral views were obtained. There is screw and rod fixation through a fracture of the basicervical proximal femur region. Alignment at the fracture site is anatomic.  The tip of the screws in the proximal femoral head. No dislocation. IMPRESSION: Screw and plate fixation with alignment anatomic in the proximal femoral region. Tip of screw in proximal femoral head. No dislocation. Electronically Signed   By: Bretta Bang III M.D.   On: 01/15/2016 20:46   Dg Femur, Min 2 Views Right  01/15/2016  CLINICAL DATA:  Right basicervical femur fracture EXAM: DG C-ARM 61-120 MIN; RIGHT FEMUR 2 VIEWS COMPARISON:  January 14, 2016 FINDINGS: Frontal and lateral views were obtained. There is screw and rod fixation through a fracture of the basicervical proximal femur region. Alignment at the fracture site is anatomic. The tip of the screws in the proximal femoral head. No dislocation. IMPRESSION: Screw and plate fixation with alignment anatomic in the proximal femoral region. Tip of screw in proximal femoral head. No dislocation. Electronically Signed   By: Bretta Bang III M.D.   On: 01/15/2016 20:46   Dg Femur Port, Min 2 Views Right  01/15/2016  CLINICAL DATA:  Status post fixation of proximal femur fracture EXAM: RIGHT FEMUR PORTABLE 1 VIEW COMPARISON:  Intraoperative images obtained earlier in the day FINDINGS: Frontal and lateral view show rod and screw fixation through a proximal femur fracture with alignment anatomic. Tip of the screws in the proximal femoral head. No dislocation. No new fracture. IMPRESSION: Alignment at the proximal femur fracture site is anatomic. Tip of screw in proximal femoral head. No dislocation. Electronically Signed   By: Bretta Bang III M.D.   On: 01/15/2016 21:22    Assessment/Plan: 2 Days Post-Op   Active Problems:   Hip fracture [facility])  Patient is doing well postop. Her hemoglobin has dropped to 7.9. I'm ordering her 1 unit of packed red blood cells as a transfusion. Intertrochanteric hip fractures have a tendency to lose a significant amount of blood. Given the patient's age and hemoglobin less than M.9 and occasional heart rates 500 I feel warranted a transfusion. I spoke with Dr. Clint Guy about this. He is in agreement with the above plan. Once the patient's hemoglobin is stable she will be ready for discharge to skilled nursing facility from an orthopedic standpoint. I will see her back in the office in 10-14 days for wound check, staple removal and x-ray. Patient may be weightbearing as tolerated on the right lower extremity. She should remain on anticoagulation therapy for 4 weeks postop. I recommend she continue Lovenox 40 mg subcutaneous daily. Patient may have her dressings changed when necessary.    Juanell Fairly , MD 01/17/2016, 8:44 AM

## 2016-01-17 NOTE — Progress Notes (Signed)
MD ortho Hyacinth MeekerMiller paged.

## 2016-01-17 NOTE — Progress Notes (Signed)
Per MD patient will likely be ready for D/C tomorrow. Plan is for [facility] to [facility]. [facility] are aware of above. CSW will continue to follow and assist as needed.   Jetta LoutBailey Morgan, LCSW [phone]

## 2016-01-17 NOTE — Progress Notes (Signed)
[facility] [place] [facility]   PATIENT NAME: Shelby George    MRN#:  [id number]  DATE OF BIRTH:  01/20/1916  SUBJECTIVE:  Hospital Day: 2 days Shelby George is a 82 y.o. female presenting with Fall .   Overnight events: No overnight events Interval Events: Complains of leg pain only when moving, noted drop in hemoglobin  REVIEW OF SYSTEMS:  CONSTITUTIONAL: No fever, fatigue or weakness.  EYES: No blurred or double vision.  EARS, NOSE, AND THROAT: No tinnitus or ear pain.  RESPIRATORY: No cough, shortness of breath, wheezing or hemoptysis.  CARDIOVASCULAR: No chest pain, orthopnea, edema.  GASTROINTESTINAL: No nausea, vomiting, diarrhea or abdominal pain.  GENITOURINARY: No dysuria, hematuria.  ENDOCRINE: No polyuria, nocturia,  HEMATOLOGY: No anemia, easy bruising or bleeding SKIN: No rash or lesion. MUSCULOSKELETAL: No joint pain or arthritis.  Right leg pain as described above NEUROLOGIC: No tingling, numbness, weakness.  PSYCHIATRY: No anxiety or depression.   DRUG ALLERGIES:   Allergies  Allergen Reactions  . Alendronate Sodium Other (See Comments)    Reaction:  Unknown   . Celebrex [Celecoxib] Other (See Comments)    Reaction:  Unknown     VITALS:  Blood pressure 141/48, pulse 98, temperature 97.9 F (36.6 C), temperature source Oral, resp. rate 18, height  (1.626 m), weight 58.151 kg (128 lb 3.2 oz), SpO2 82 %.  PHYSICAL EXAMINATION:  VITAL SIGNS: Filed Vitals:   01/17/16 0823 01/17/16 1100  BP: 141/48   Pulse: 92 98  Temp: 97.9 F (36.6 C)   Resp: 18    GENERAL:82 y.o.female currently in minimal acute distress. Given pain HEAD: Normocephalic, atraumatic.  EYES: Pupils equal, round, reactive to light. Extraocular muscles intact. No scleral icterus.  MOUTH: Moist mucosal membrane. Dentition intact. No abscess noted.  EAR, NOSE, THROAT: Clear without exudates. No external lesions.  NECK: Supple. No thyromegaly. No  nodules. No JVD.  PULMONARY: Clear to ascultation, without wheeze rails or rhonci. No use of accessory muscles, Good respiratory effort. good air entry bilaterally CHEST: Nontender to palpation.  CARDIOVASCULAR: S1 and S2. Regular rate and rhythm. No murmurs, rubs, or gallops. No edema. Pedal pulses 2+ bilaterally.  GASTROINTESTINAL: Soft, nontender, nondistended. No masses. Positive bowel sounds. No hepatosplenomegaly.  MUSCULOSKELETAL: No swelling, clubbing, or edema. Range of motion full in all extremities. But right leg movement causes pain NEUROLOGIC: Cranial nerves II through XII are intact. No gross focal neurological deficits. Sensation intact. Reflexes intact.  SKIN: No ulceration, lesions, rashes, or cyanosis. Skin warm and dry. Turgor intact.  PSYCHIATRIC: Mood, affect within normal limits. The patient is awake, alert and oriented x 3. Insight, judgment intact.      LABORATORY PANEL:   CBC  Recent Labs Lab 01/17/16 0609  WBC 9.5  HGB 7.9*  HCT 23.1*  PLT 102*   ------------------------------------------------------------------------------------------------------------------  Chemistries   Recent Labs Lab 01/17/16 0609  NA 138  K 3.9  CL 112*  CO2 24  GLUCOSE 119*  BUN 22*  CREATININE 0.60  CALCIUM 8.5*   ------------------------------------------------------------------------------------------------------------------  Cardiac Enzymes  Recent Labs Lab 01/14/16 2325  TROPONINI <0.03   ------------------------------------------------------------------------------------------------------------------  RADIOLOGY:  Dg Tibia/fibula Right  01/15/2016  CLINICAL DATA:  Recheck fibular fracture EXAM: RIGHT TIBIA AND FIBULA - 2 VIEW COMPARISON:  01/14/2016, 12/07/2015 FINDINGS: There is a nondisplaced anatomically aligned diaphyseal fracture of the fibula just distal to the mid point with solid bridging callus. There is prior plate screw fixation of the distal  fibula and 2 medial malleolar screws. IMPRESSION: Fibular diaphyseal fracture is healing in good alignment and position, with solid bridging callus. Electronically Signed   By: Ellery Plunk M.D.   On: 01/15/2016 21:23   Dg C-arm 1-60 Min  01/15/2016  CLINICAL DATA:  Right basicervical femur fracture EXAM: DG C-ARM 61-120 MIN; RIGHT FEMUR 2 VIEWS COMPARISON:  January 14, 2016 FINDINGS: Frontal and lateral views were obtained. There is screw and rod fixation through a fracture of the basicervical proximal femur region. Alignment at the fracture site is anatomic. The tip of the screws in the proximal femoral head. No dislocation. IMPRESSION: Screw and plate fixation with alignment anatomic in the proximal femoral region. Tip of screw in proximal femoral head. No dislocation. Electronically Signed   By: Bretta Bang III M.D.   On: 01/15/2016 20:46   Dg Femur, Min 2 Views Right  01/15/2016  CLINICAL DATA:  Right basicervical femur fracture EXAM: DG C-ARM 61-120 MIN; RIGHT FEMUR 2 VIEWS COMPARISON:  January 14, 2016 FINDINGS: Frontal and lateral views were obtained. There is screw and rod fixation through a fracture of the basicervical proximal femur region. Alignment at the fracture site is anatomic. The tip of the screws in the proximal femoral head. No dislocation. IMPRESSION: Screw and plate fixation with alignment anatomic in the proximal femoral region. Tip of screw in proximal femoral head. No dislocation. Electronically Signed   By: Bretta Bang III M.D.   On: 01/15/2016 20:46   Dg Femur Port, Min 2 Views Right  01/15/2016  CLINICAL DATA:  Status post fixation of proximal femur fracture EXAM: RIGHT FEMUR PORTABLE 1 VIEW COMPARISON:  Intraoperative images obtained earlier in the day FINDINGS: Frontal and lateral view show rod and screw fixation through a proximal femur fracture with alignment anatomic. Tip of the screws in the proximal femoral head. No dislocation. No new fracture. IMPRESSION:  Alignment at the proximal femur fracture site is anatomic. Tip of screw in proximal femoral head. No dislocation. Electronically Signed   By: Bretta Bang III M.D.   On: 01/15/2016 21:22    EKG:   Orders placed or performed during the hospital encounter of 01/14/16  . ED EKG  . ED EKG  . EKG 12-Lead  . EKG 12-Lead    ASSESSMENT AND PLAN:   Shelby George is a 82 y.o. female presenting with Fall . Admitted 01/14/2016 : Day #: 53 days   82 year old female with past medical history significant for osteoporosis, pulmonary hypertension, systemic hypertension, mild dementia presents to the hospital after a fall and noted to have right hip fracture.  1. Right hip fracture- POD #2 -Pain control.   -Postoperative DVT prophylaxis and physical therapy consults. Acute blood loss anemia secondary to surgery, transfuse 1 unit packed red blood cells today follow CBC trend  2. Hypotension- post operatively low BP- hold Imdur and metoprolol   3 COPD- continue her inhalers 4 Mild dementia-continue Galantamine 5 DVT Prophylaxis- Lovenox started after surgery    All the records are reviewed and case discussed with Care Management/Social Workerr. Management plans discussed with the patient, family and they are in agreement.  CODE STATUS: full TOTAL TIME TAKING CARE OF THIS PATIENT: 33 minutes.   POSSIBLE D/C IN 1-2DAYS, DEPENDING ON CLINICAL CONDITION.   Hower,  Mardi Mainland.D on 01/17/2016 at 11:40 AM  Between 7am to 6pm - Pager - [phone]  After 6pm: House Pager: - [phone]  [facility]  Office  [phone]  CC: Primary  care physician; Danella PentonMark F Miller, MD

## 2016-01-17 NOTE — Care Management Important Message (Signed)
Important Message  Patient Details  Name: Al PimpleMildred E Devivo MRN: [id number] Date of Birth: [birth year]   Medicare Important Message Given:  Yes    Olegario MessierKathy A Martine Trageser 01/17/2016, 11:41 AM

## 2016-01-17 NOTE — Progress Notes (Signed)
Patient has voided once this shift. Paged [facility] ortho bladder scan results 348. Will cont to monitor

## 2016-01-17 NOTE — Progress Notes (Signed)
OT Cancellation Note  Patient Details Name: Shelby George MRN: [id number] DOB: 1916-02-22   Cancelled Treatment:      Attempted to see patient earlier however, patient received blood transfusion this date and now confused in the afternoon, disoriented to place, time and situation.  Will continue attempts for OT.  Lovett,Amy 01/17/2016, 3:55 PM

## 2016-01-17 NOTE — Plan of Care (Signed)
Problem: SLP Dysphagia Goals Goal: Misc Dysphagia Goal Pt will safely tolerate po diet of least restrictive consistency w/ no overt s/s of aspiration noted by Staff/pt/family x3 sessions.    

## 2016-01-17 NOTE — Progress Notes (Signed)
ANTICOAGULATION CONSULT NOTE -Follow up Consult  Pharmacy Consult for enoxaparin Indication: VTE prophylaxis  Allergies  Allergen Reactions  . Alendronate Sodium Other (See Comments)    Reaction:  Unknown   . Celebrex [Celecoxib] Other (See Comments)    Reaction:  Unknown     Patient Measurements: Height: 5\' 4"  (162.6 cm) Weight: 128 lb 3.2 oz (58.151 kg) IBW/kg (Calculated) : 54.7  Vital Signs: Temp: 97.9 F (36.6 C) (04/13 0823) Temp Source: Oral (04/13 0823) BP: 141/48 mmHg (04/13 0823) Pulse Rate: 92 (04/13 0823)  Labs:  Recent Labs  01/14/16 2325 01/15/16 2232 01/16/16 0606 01/16/16 1732 01/17/16 0609  HGB 12.7 10.4* 8.7* 8.7* 7.9*  HCT 37.9 31.6* 25.9*  --  23.1*  PLT 174 142* 111*  --  102*  CREATININE 0.79 0.58 0.66  --  0.60  TROPONINI <0.03  --   --   --   --     Estimated Creatinine Clearance: 34.7 mL/min (by C-G formula based on Cr of 0.6).  Assessment: 89 yof cc fall/hip fracture. Patient s/p hip surgery 4/11. Surgeon entered Lovenox 40 mg subcutaneously every 24 hours starting at 0800 on 01/16/16. That dose is appropriate for her BMI and CrCl.     Goal of Therapy:  Monitor platelets by anticoagulation protocol: Yes   Plan:  Lovenox 40 mg subcutaneously once daily.  4/12: Hgb dropped to 8.7. MD aware. Plan to transfuse if HGB < 8.0. Will continue Lovenox currently and follow.  4/13 Hgb 7.9 - one unit of PRBC ordered to be transfused today. CBC already ordered for tomorrow AM. Per MD note 4/13, continue Lovenox 40 mg SQ q24h.    Marty HeckWang, Margurete Guaman L, Pharm.D., BCPS Clinical Pharmacist 01/17/2016,10:18 AM

## 2016-01-17 NOTE — Progress Notes (Signed)
Physical Therapy Treatment Patient Details Name: Shelby PimpleMildred E George MRN: [id number] DOB: 04-21-1916 Today's Date: 01/17/2016    History of Present Illness Pt sufferred a fall with a R hip fracture. She underwent R hip ORIF without reported post-op complications. She has sufferred 2 falls in the last 12 months    PT Comments    Pt in bed receiving blood transfusion for hemoglobin of 7.9. Pt confused/disoriented this afternoon to situation and whereabouts. Pt wonders how she got in "this room" (feels room has changed since the morning). Pt also asks what happened to her stating "no one told me anything". Pt feels she walked in here "just fine" and doesn't remember the rest. Pt does follow instructions for supine exercises requiring active assisted range of motion for all exercises on Bilateral lower extremities. No further therapy attempted due to current transfusion and disorientation. Continue PT for progression of range of motion and strength to improve all functional mobility.   Follow Up Recommendations  SNF     Equipment Recommendations  None recommended by PT    Recommendations for Other Services       Precautions / Restrictions Precautions Precautions: Fall Restrictions Weight Bearing Restrictions: Yes RLE Weight Bearing: Weight bearing as tolerated    Mobility  Bed Mobility Overal bed mobility: Needs Assistance Bed Mobility: Supine to Sit;Sit to Supine     Supine to sit: Max assist Sit to supine: Max assist;+2 for physical assistance   General bed mobility comments: Not tested, pt in bed receiveing blood transfusion for Hgb of 7.9  Transfers Overall transfer level: Needs assistance Equipment used: Rolling walker (2 wheeled);None Transfers: Sit to/from UGI CorporationStand;Stand Pivot Transfers Sit to Stand: Max assist;+2 physical assistance Stand pivot transfers: Max assist;+2 physical assistance       General transfer comment: Attempted with rw; pt unable to effectively use.  Requires physical hand placement to/from rw. Pt is able to stand in extreme trunk flexion with rw for stand personal hygiene with Mod A  Ambulation/Gait             General Gait Details: unable   Stairs            Wheelchair Mobility    Modified Rankin (Stroke Patients Only)       Balance Overall balance assessment: Needs assistance Sitting-balance support: Feet supported;Bilateral upper extremity supported Sitting balance-Leahy Scale: Poor Sitting balance - Comments: leans onto L elbow; unable to sit upright without assist                            Cognition Arousal/Alertness: Lethargic Behavior During Therapy: WFL for tasks assessed/performed Overall Cognitive Status: Impaired/Different from baseline Area of Impairment: Orientation;Memory;Awareness Orientation Level: Disoriented to;Place;Time;Situation   Memory: Decreased short-term memory     Awareness:  ("bewildered" as to how she got here, "no one told me")        Exercises General Exercises - Lower Extremity Ankle Circles/Pumps: AROM;AAROM;Both;20 reps;Supine Quad Sets: Other (comment) (Attempted, unable to follow instruction for exercise) Short Arc Quad: AAROM;Both;20 reps;Supine Heel Slides: AAROM;Both;20 reps;Supine Hip ABduction/ADduction: AAROM;Both;20 reps;Supine Straight Leg Raises: AAROM;Both;Supine;20 reps Other Exercises Other Exercises: adductor squeeze 20x supine    General Comments        Pertinent Vitals/Pain Pain Assessment:  (Cannot quantify, says "it hurts!") Pain Score: 10-Worst pain ever Pain Location: R hip Pain Intervention(s): Monitored during session;Limited activity within patient's tolerance    Home Living  Prior Function            PT Goals (current goals can now be found in the care plan section) Progress towards PT goals: Progressing toward goals (slowly)    Frequency  BID    PT Plan Current plan remains  appropriate    Co-evaluation             End of Session   Activity Tolerance: Patient limited by lethargy (limited by orientation) Patient left: in bed;with call bell/phone within reach;with bed alarm set;with SCD's reapplied     Time: 1610-9604 PT Time Calculation (min) (ACUTE ONLY): 24 min  Charges:  $Therapeutic Exercise: 23-37 mins                    G CodesKristeen Miss, PTA 01/17/2016, 3:03 PM

## 2016-01-18 LAB — TYPE AND SCREEN
ABO/RH(D): O POS
ANTIBODY SCREEN: NEGATIVE
UNIT DIVISION: 0
Unit division: 0

## 2016-01-18 LAB — CBC
HEMATOCRIT: 27.9 % — AB (ref 35.0–47.0)
HEMOGLOBIN: 9.7 g/dL — AB (ref 12.0–16.0)
MCH: 31.6 pg (ref 26.0–34.0)
MCHC: 34.8 g/dL (ref 32.0–36.0)
MCV: 90.6 fL (ref 80.0–100.0)
Platelets: 115 10*3/uL — ABNORMAL LOW (ref 150–440)
RBC: 3.08 MIL/uL — ABNORMAL LOW (ref 3.80–5.20)
RDW: 16.6 % — AB (ref 11.5–14.5)
WBC: 9.4 10*3/uL (ref 3.6–11.0)

## 2016-01-18 MED ORDER — BISACODYL 10 MG RE SUPP: 10.0000 mg | Freq: Every day | RECTAL | Status: AC | PRN

## 2016-01-18 MED ORDER — SENNA 8.6 MG PO TABS: 1.0000 | ORAL_TABLET | Freq: Two times a day (BID) | ORAL | Status: AC

## 2016-01-18 MED ORDER — FERROUS SULFATE 325 (65 FE) MG PO TABS: 325.0000 mg | ORAL_TABLET | Freq: Three times a day (TID) | ORAL | Status: AC

## 2016-01-18 MED ORDER — OXYCODONE HCL 5 MG PO TABS: 5.0000 mg | ORAL_TABLET | ORAL | Status: AC | PRN

## 2016-01-18 MED ORDER — ENOXAPARIN SODIUM 40 MG/0.4ML ~~LOC~~ SOLN: 40.0000 mg | SUBCUTANEOUS | Status: AC

## 2016-01-18 MED ORDER — DOCUSATE SODIUM 100 MG PO CAPS: 100.0000 mg | ORAL_CAPSULE | Freq: Two times a day (BID) | ORAL | Status: AC

## 2016-01-18 MED ORDER — POLYETHYLENE GLYCOL 3350 17 G PO PACK: 17.0000 g | PACK | Freq: Every day | ORAL | Status: AC | PRN

## 2016-01-18 NOTE — Discharge Summary (Addendum)
[facility] -  at [facility]   PATIENT NAME: Shelby George    MR#:  [id number]  DATE OF BIRTH:  08-01-1916  DATE OF ADMISSION:  01/14/2016 ADMITTING PHYSICIAN: Arnaldo Natal, MD  DATE OF DISCHARGE: 01/18/2016  PRIMARY CARE PHYSICIAN: Danella Penton, MD    ADMISSION DIAGNOSIS:  Intertrochanteric fracture of right hip, closed, initial encounter (HCC) [S72.141A]  DISCHARGE DIAGNOSIS:  Intertrochanteric fracture of right hip Post op anemia  SECONDARY DIAGNOSIS:   Past Medical History  Diagnosis Date  . Hypothyroidism   . Lumbar compression fracture (HCC)   . Osteoporosis   . Pulmonary hypertension (HCC)   . Systemic primary arterial hypertension   . Sleep apnea   . Mild dementia     HOSPITAL COURSE:  Shelby George  is a 82 y.o. female admitted 01/14/2016 with chief complaint Fall . Please see H&P performed by Arnaldo Natal, MD for further information. Presenting after mechanical fall found to have right hip fracture. Underwent intramedullary fixation for right intertrochanteric hip fracture 01/15/16 without initial complication. She was noted to have a drop in hemoglobin, 10.4 to 7.9 at the lowest, for which she received a blood transfusion - with appropriate response. Remains hemodynamically stable and ready for discharge.   DISCHARGE CONDITIONS:   stable  CONSULTS OBTAINED:  Treatment Team:  Wyatt Haste, MD  DRUG ALLERGIES:   Allergies  Allergen Reactions  . Alendronate Sodium Other (See Comments)    Reaction:  Unknown   . Celebrex [Celecoxib] Other (See Comments)    Reaction:  Unknown     DISCHARGE MEDICATIONS:   Current Discharge Medication List    START taking these medications   Details  bisacodyl (DULCOLAX) 10 MG suppository Place 1 suppository (10 mg total) rectally daily as needed for moderate constipation. Qty: 12 suppository, Refills: 0    docusate sodium (COLACE) 100 MG capsule Take 1 capsule (100 mg total) by  mouth 2 (two) times daily. Qty: 30 capsule, Refills: 0    enoxaparin (LOVENOX) 40 MG/0.4ML injection Inject 0.4 mLs (40 mg total) into the skin daily. Qty: 30 Syringe, Refills: 0    ferrous sulfate 325 (65 FE) MG tablet Take 1 tablet (325 mg total) by mouth 3 (three) times daily with meals. Qty: 90 tablet, Refills: 3    oxyCODONE (OXY IR/ROXICODONE) 5 MG immediate release tablet Take 1-2 tablets (5-10 mg total) by mouth every 4 (four) hours as needed for breakthrough pain ((for MODERATE breakthrough pain)). Qty: 30 tablet, Refills: 0    polyethylene glycol (MIRALAX / GLYCOLAX) packet Take 17 g by mouth daily as needed for mild constipation. Qty: 14 each, Refills: 0    senna (SENOKOT) 8.6 MG TABS tablet Take 1 tablet (8.6 mg total) by mouth 2 (two) times daily. Qty: 120 each, Refills: 0      CONTINUE these medications which have NOT CHANGED   Details  budesonide-formoterol (SYMBICORT) 80-4.5 MCG/ACT inhaler Inhale 1 puff into the lungs 2 (two) times daily.    Calcium Carbonate-Vitamin D (CALTRATE 600+D PO) Take 1 tablet by mouth daily.    galantamine (RAZADYNE) 8 MG tablet Take 8 mg by mouth daily.    isosorbide mononitrate (IMDUR) 60 MG 24 hr tablet Take 90 mg by mouth daily.     levothyroxine (SYNTHROID, LEVOTHROID) 100 MCG tablet Take 100 mcg by mouth daily before breakfast.    metoprolol tartrate (LOPRESSOR) 25 MG tablet Take 25 mg by mouth 2 (two) times daily.  DISCHARGE INSTRUCTIONS:    DIET:  Regular diet  DISCHARGE CONDITION:  Stable  ACTIVITY:  weightbearing as tolerated on the right lower extremity  OXYGEN:  Home Oxygen: Yes.     Oxygen Delivery: 2L [place]  DISCHARGE LOCATION:  nursing home   If you experience worsening of your admission symptoms, develop shortness of breath, life threatening emergency, suicidal or homicidal thoughts you must seek medical attention immediately by calling 911 or calling your MD immediately  if symptoms less  severe.  You Must read complete instructions/literature along with all the possible adverse reactions/side effects for all the Medicines you take and that have been prescribed to you. Take any new Medicines after you have completely understood and accpet all the possible adverse reactions/side effects.   Please note  You were cared for by a hospitalist during your hospital stay. If you have any questions about your discharge medications or the care you received while you were in the hospital after you are discharged, you can call the unit and asked to speak with the hospitalist on call if the hospitalist that took care of you is not available. Once you are discharged, your primary care physician will handle any further medical issues. Please note that NO REFILLS for any discharge medications will be authorized once you are discharged, as it is imperative that you return to your primary care physician (or establish a relationship with a primary care physician if you do not have one) for your aftercare needs so that they can reassess your need for medications and monitor your lab values.    On the day of Discharge:   VITAL SIGNS:  Blood pressure 164/64, pulse 92, temperature 98.5 F (36.9 C), temperature source Oral, resp. rate 24, height  (1.626 m), weight 58.151 kg (128 lb 3.2 oz), SpO2 93 %.  I/O:   Intake/Output Summary (Last 24 hours) at 01/18/16 0754 Last data filed at 01/18/16 0439  Gross per 24 hour  Intake    380 ml  Output    125 ml  Net    255 ml    PHYSICAL EXAMINATION:  GENERAL:  82 y.o.-year-old patient lying in the bed with no acute distress. - more confused this morning (baseline dementia)  EYES: Pupils equal, round, reactive to light and accommodation. No scleral icterus. Extraocular muscles intact.  HEENT: Head atraumatic, normocephalic. Oropharynx and nasopharynx clear.  NECK:  Supple, no jugular venous distention. No thyroid enlargement, no tenderness.  LUNGS:  Normal breath sounds bilaterally, no wheezing, rales,rhonchi or crepitation. No use of accessory muscles of respiration.  CARDIOVASCULAR: S1, S2 normal. No murmurs, rubs, or gallops.  ABDOMEN: Soft, non-tender, non-distended. Bowel sounds present. No organomegaly or mass.  EXTREMITIES: No pedal edema, cyanosis, or clubbing. Still some pain elicited with movement of right lower extremity  NEUROLOGIC: Cranial nerves II through XII are intact. Muscle strength 4/5 in all extremities. Sensation intact. Gait not checked.  PSYCHIATRIC: The patient is awake oriented to self  SKIN: No obvious rash, lesion, or ulcer.   DATA REVIEW:   CBC  Recent Labs Lab 01/18/16 0528  WBC 9.4  HGB 9.7*  HCT 27.9*  PLT 115*    Chemistries   Recent Labs Lab 01/17/16 0609  NA 138  K 3.9  CL 112*  CO2 24  GLUCOSE 119*  BUN 22*  CREATININE 0.60  CALCIUM 8.5*    Cardiac Enzymes  Recent Labs Lab 01/14/16 2325  TROPONINI <0.03    Microbiology Results  Results for  orders placed or performed during the hospital encounter of 01/14/16  MRSA PCR Screening     Status: None   Collection Time: 01/15/16  3:29 AM  Result Value Ref Range Status   MRSA by PCR NEGATIVE NEGATIVE Final    Comment:        The GeneXpert MRSA Assay (FDA approved for NASAL specimens only), is one component of a comprehensive MRSA colonization surveillance program. It is not intended to diagnose MRSA infection nor to guide or monitor treatment for MRSA infections.     RADIOLOGY:  No results found.   Management plans discussed with the patient, family and they are in agreement.  CODE STATUS:  Code Status History    Date Active Date Inactive Code Status Order ID Comments User Context   01/15/2016  5:53 PM 01/16/2016  7:24 PM DNR [id number]  Cordie GriceAudry A Rodriguez, RN Inpatient   01/15/2016  3:16 AM 01/15/2016  5:53 PM Full Code [id number]  Arnaldo NatalMichael S Diamond, MD Inpatient    Questions for Most Recent Historical Code Status  (Order [id number])    Question Answer Comment   In the event of cardiac or respiratory ARREST Do not call a "code blue"    In the event of cardiac or respiratory ARREST Do not perform Intubation, CPR, defibrillation or ACLS    In the event of cardiac or respiratory ARREST Use medication by any route, position, wound care, and other measures to relive pain and suffering. May use oxygen, suction and manual treatment of airway obstruction as needed for comfort.     Advance Directive Documentation        Most Recent Value   Type of Advance Directive  Healthcare Power of Attorney   Pre-existing out of facility DNR order (yellow form or pink MOST form)     "MOST" Form in Place?        TOTAL TIME TAKING CARE OF THIS PATIENT: 33 minutes.    Hower,  Mardi MainlandDavid K M.D on 01/18/2016 at 7:54 AM  Between 7am to 6pm - Pager - [phone]  After 6pm go to [email or web address] - Social research officer, [email or web address]  [facility] [place] [facility]  Office  [phone]  CC: Primary care physician; Danella PentonMark F Miller, MD

## 2016-01-18 NOTE — Clinical Social Work Placement (Signed)
   CLINICAL SOCIAL WORK PLACEMENT  NOTE  Date:  01/18/2016  Patient Details  Name: Shelby George MRN: [id number] Date of Birth: 1916-06-19  Clinical Social Work is seeking post-discharge placement for this patient at the Skilled  Nursing Facility level of care (*CSW will initial, date and re-position this form in  chart as items are completed):  Yes   Patient/family provided with [place] Clinical Social Work Department's list of facilities offering this level of care within the geographic area requested by the patient (or if unable, by the patient's family).  Yes   Patient/family informed of their freedom to choose among providers that offer the needed level of care, that participate in Medicare, Medicaid or managed care program needed by the patient, have an available bed and are willing to accept the patient.  Yes   Patient/family informed of 's ownership interest in [facility] and [facility], as well as of the fact that they are under no obligation to receive care at these facilities.  PASRR submitted to EDS on       PASRR number received on       Existing PASRR number confirmed on 01/15/16     FL2 transmitted to all facilities in geographic area requested by pt/family on 01/15/16     FL2 transmitted to all facilities within larger geographic area on       Patient informed that his/her managed care company has contracts with or will negotiate with certain facilities, including the following:        Yes   Patient/family informed of bed offers received.  Patient chooses bed at  [facility] )     Physician recommends and patient chooses bed at      Patient to be transferred to  [facility] ) on 01/18/16.  Patient to be transferred to facility by  [facility] )     Patient family notified on 01/18/16 of transfer.  Name of family member notified:   ([name] [name] is aware of D/C today. )     PHYSICIAN        Additional Comment:    _______________________________________________ Haig ProphetMorgan, Joss Mcdill G, LCSW 01/18/2016, 9:44 AM

## 2016-01-18 NOTE — Progress Notes (Addendum)
[facility]. Report called to [facility]. IV removed and belongings packed. Family bedside. EMS called.

## 2016-01-18 NOTE — Progress Notes (Signed)
ANTICOAGULATION CONSULT NOTE -Follow up Consult  Pharmacy Consult for enoxaparin Indication: VTE prophylaxis  Allergies  Allergen Reactions  . Alendronate Sodium Other (See Comments)    Reaction:  Unknown   . Celebrex [Celecoxib] Other (See Comments)    Reaction:  Unknown     Patient Measurements: Height: 5\' 4"  (162.6 cm) Weight: 128 lb 3.2 oz (58.151 kg) IBW/kg (Calculated) : 54.7  Vital Signs: Temp: 98.4 F (36.9 C) (04/14 0439) Temp Source: Oral (04/14 0439) BP: 152/59 mmHg (04/14 0439) Pulse Rate: 88 (04/14 0439)  Labs:  Recent Labs  01/15/16 2232 01/16/16 0606  01/17/16 0609 01/18/16 0528  HGB 10.4* 8.7*  < > 7.9* 9.7*  HCT 31.6* 25.9*  --  23.1* 27.9*  PLT 142* 111*  --  102* 115*  CREATININE 0.58 0.66  --  0.60  --   < > = values in this interval not displayed.  Estimated Creatinine Clearance: 34.7 mL/min (by C-G formula based on Cr of 0.6).  Assessment: 89 yof cc fall/hip fracture. Patient s/p hip surgery 4/11. Surgeon entered Lovenox 40 mg subcutaneously every 24 hours starting at 0800 on 01/16/16. That dose is appropriate for her BMI and CrCl.     Goal of Therapy:  Monitor platelets by anticoagulation protocol: Yes   Plan:  Lovenox 40 mg subcutaneously once daily.  4/12: Hgb dropped to 8.7. MD aware. Plan to transfuse if HGB < 8.0. Will continue Lovenox currently and follow.  4/13 Hgb 7.9 - one unit of PRBC ordered to be transfused today. CBC already ordered for tomorrow AM. Per MD note 4/13, continue Lovenox 40 mg SQ q24h. 4/14 hgb 9.7 after one unit PRBC. plt 115 continue lovenox 40 q 24 hours.   Olene FlossMelissa D Jacorie Ernsberger, Pharm.D. Clinical Pharmacist 01/18/2016,7:52 AM

## 2016-01-18 NOTE — Progress Notes (Signed)
[facility] is medically stable [facility] to [facility]. RN will call report to 500 hall RN and arrange EMS for transport. Clinical Child psychotherapistocial Worker (CSW) sent D/C Summary, FL2 and D/C Packet to [employer] via [employer]. Patient is aware of above. CSW contacted patient's daughter [name] and made her aware of above. Please reconsult if future social work needs arise. CSW signing off.   Jetta LoutBailey Morgan, LCSW [phone]

## 2016-01-18 NOTE — Progress Notes (Signed)
Checked to see if pre-authorization would be needed for non-emergent EMS transport. Per [employer] benefits obtained online through [employer], patient has a [employer] Medicare Advantage PPO policy.  Medicare PPO plans do not require pre-auth for non-emergent ground transports using service codes A0426 or A0428.   

## 2016-01-18 NOTE — Progress Notes (Signed)
Physical Therapy Treatment Patient Details Name: Shelby George MRN: [id number] DOB: 02-07-1916 Today's Date: 01/18/2016    History of Present Illness Pt sufferred a fall with a R hip fracture. She underwent R hip ORIF without reported post-op complications. She has sufferred 2 falls in the last 12 months    PT Comments    Pt is very limited with her ability to tolerate much with PT.  She has a lot of pain with any mobility/R LE movement and generally appears confused/distracted t/o session. She is able to do some limited exercises with much cuing and some assist and despite appearing like she was going to assist getting to sitting she was unable to help and needed max assist getting to/from supine.   Follow Up Recommendations  SNF     Equipment Recommendations       Recommendations for Other Services       Precautions / Restrictions Precautions Precautions: Fall Restrictions Weight Bearing Restrictions: Yes RLE Weight Bearing: Weight bearing as tolerated    Mobility  Bed Mobility Overal bed mobility: Needs Assistance Bed Mobility: Supine to Sit;Sit to Supine     Supine to sit: Max assist Sit to supine: Max assist;+2 for physical assistance   General bed mobility comments: Pt very hesitant and pain focused getting to sitting.  Pt is unable to sit fully upright and is very pain focused in sitting.    Transfers                 General transfer comment: Set pt up for standing but she was unable to tolerate upright sitting, and was unable to even try standing.   Ambulation/Gait                 Stairs            Wheelchair Mobility    Modified Rankin (Stroke Patients Only)       Balance     Sitting balance-Leahy Scale: Poor Sitting balance - Comments: again leaning on L elbow and unable to attain/tolerate upright                            Cognition Arousal/Alertness: Awake/alert Behavior During Therapy: Restless Overall  Cognitive Status: Difficult to assess Area of Impairment: Attention;Awareness;Safety/judgement                    Exercises General Exercises - Lower Extremity Ankle Circles/Pumps: AROM;AAROM;Both;20 reps;Supine Quad Sets: 10 reps;Strengthening Gluteal Sets: Strengthening;10 reps Short Arc Quad: 15 reps;AAROM Heel Slides: AROM;10 reps;AAROM Hip ABduction/ADduction: AROM;10 reps;AAROM    General Comments        Pertinent Vitals/Pain Pain Assessment: No/denies pain    Home Living                      Prior Function            PT Goals (current goals can now be found in the care plan section)      Frequency  BID    PT Plan Current plan remains appropriate    Co-evaluation             End of Session   Activity Tolerance: Patient limited by pain;Treatment limited secondary to agitation Patient left: with call bell/phone within reach;with bed alarm set     Time: [id number] PT Time Calculation (min) (ACUTE ONLY): 25 min  Charges:  $Gait Training: 8-22 mins $Therapeutic Exercise: 8-22  mins                    G Codes:     Loran Senters, [place], DPT [phone]   Malachi Pro 01/18/2016, 11:15 AM

## 2016-01-18 NOTE — Progress Notes (Signed)
Patient has not urinated all shift. MD notified. Order to bladder scan

## 2016-01-18 NOTE — Progress Notes (Signed)
MD notified bladder scan amount [id number]. Order to in and out cath received

## 2016-01-18 NOTE — Care Management Important Message (Signed)
Important Message  Patient Details  Name: Shelby George MRN: [id number] Date of Birth: 07-09-1916   Medicare Important Message Given:  Yes    Linnie Delgrande A, RN 01/18/2016, 6:55 AM

## 2016-04-05 DEATH — deceased

## 2017-11-21 IMAGING — CT CT HIP*R* W/O CM
2 of 5 series · 16 of 46 positions shown, 20 images · non-contrast
Comparison: Radiographs 12/07/2015.

CLINICAL DATA: Chronic right hip pain.  Abnormal x-ray.

EXAM:
CT OF THE RIGHT HIP WITHOUT CONTRAST
TECHNIQUE: Multidetector CT imaging of the right hip was performed according to
the standard protocol. Multiplanar CT image reconstructions were
also generated.

[Series 3: hip soft tissue · axial · 0.35mm/px · z∈[-389,-277]mm · 13 of 63 slices shown, 16 images]
[im 5/63  soft-tissue]
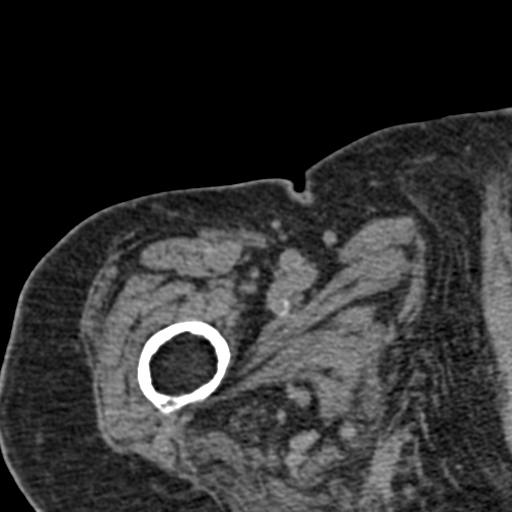
[im 5/63  bone]
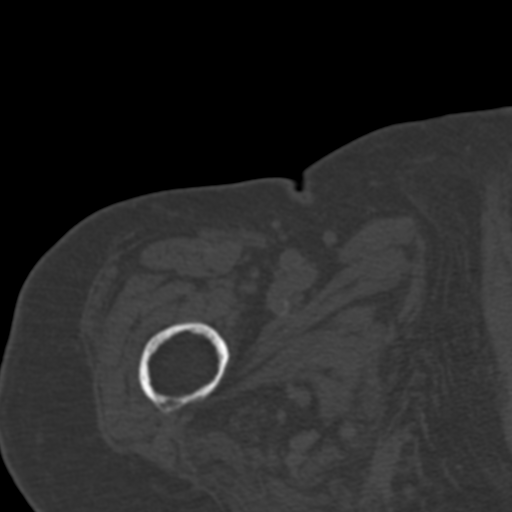
[im 11/63  soft-tissue]
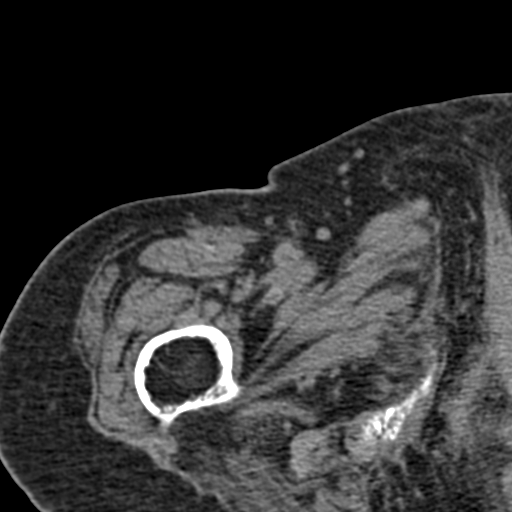
[im 17/63  soft-tissue]
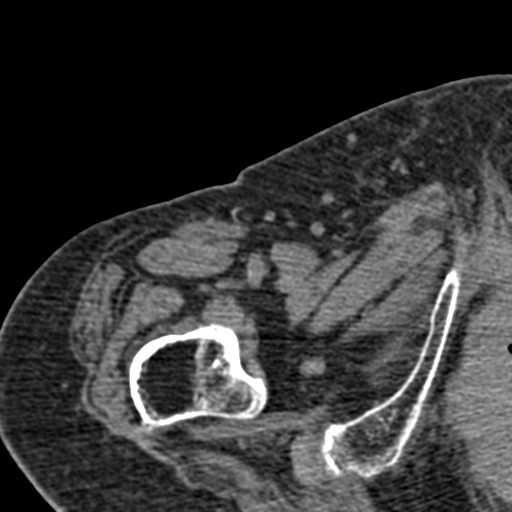
[im 23/63  soft-tissue]
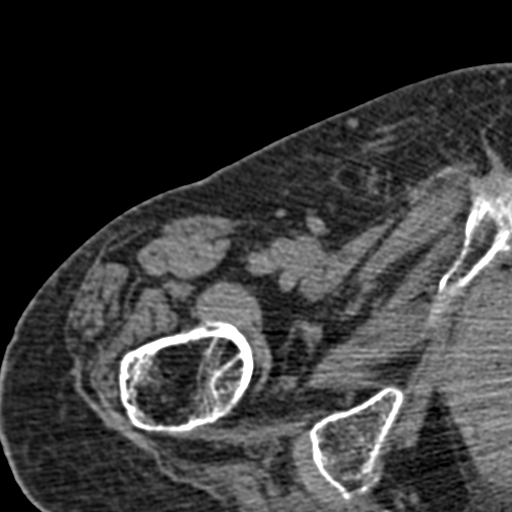
[im 29/63  soft-tissue]
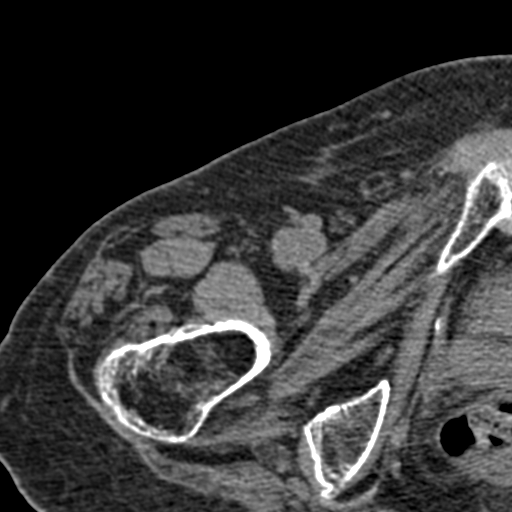
[im 35/63  soft-tissue]
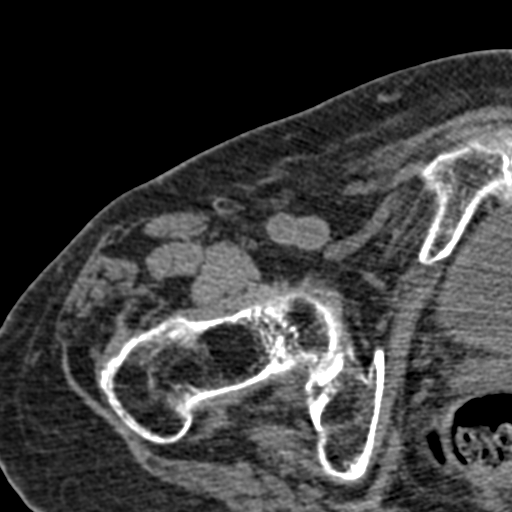
[im 41/63  soft-tissue]
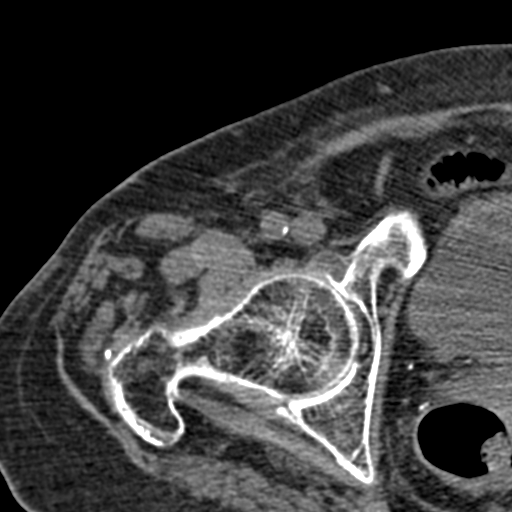
[im 47/63  soft-tissue]
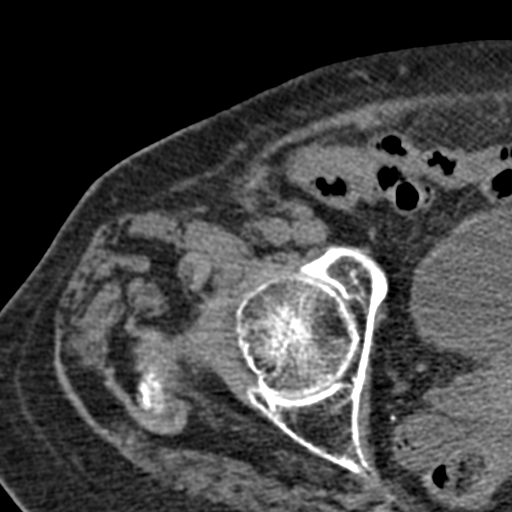
[im 53/63  soft-tissue]
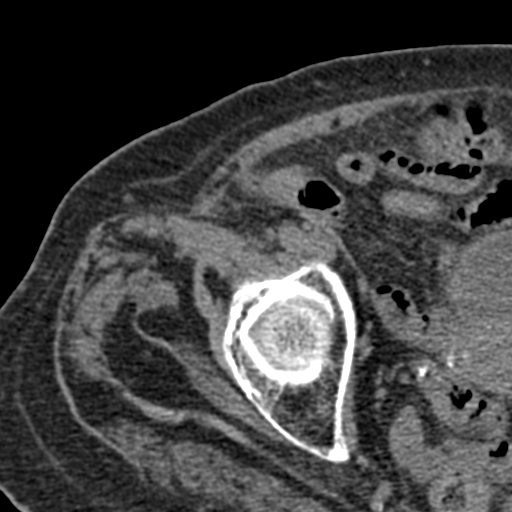
[im 53/63  bone]
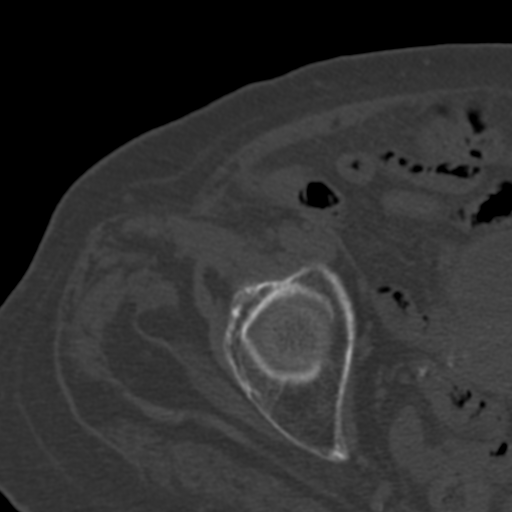
[im 55/63  lung]
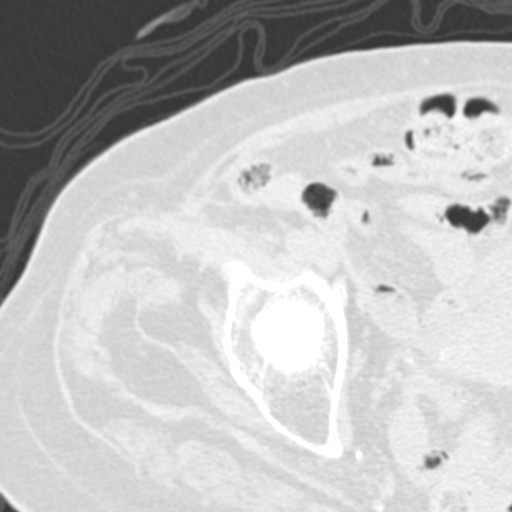
[im 57/63  lung]
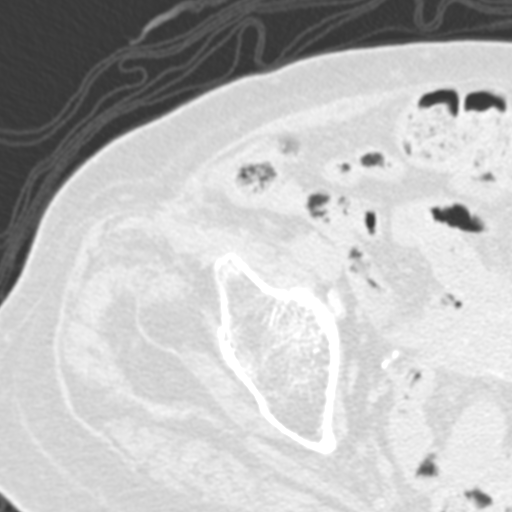
[im 59/63  soft-tissue]
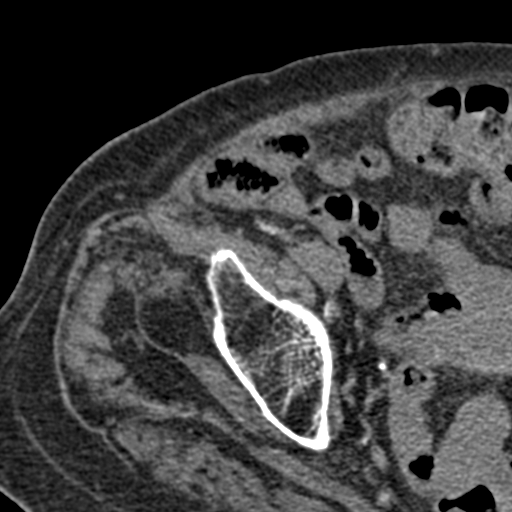
[im 59/63  lung]
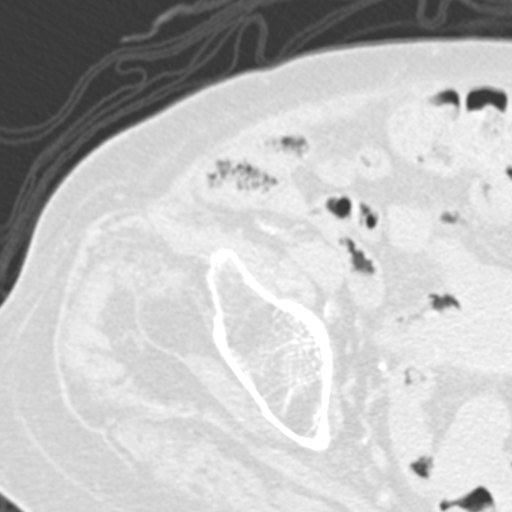
[im 61/63  lung]
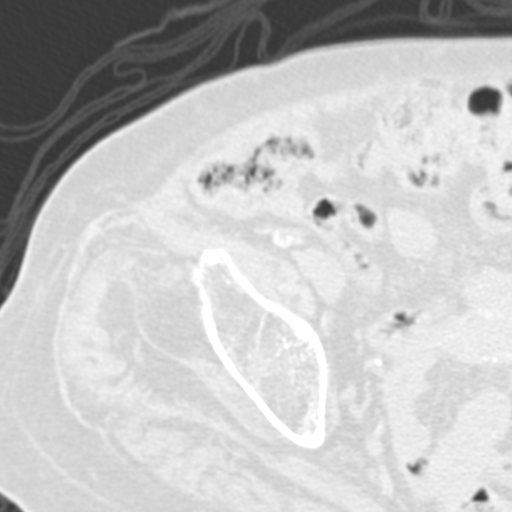

[Series 4: hip osteo cor · coronal · 0.41mm/px · 3 of 80 slices shown, 4 images]
[im 20/80  soft-tissue]
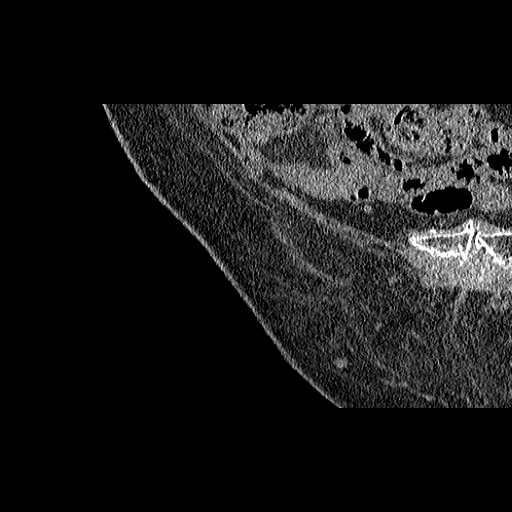
[im 40/80  soft-tissue]
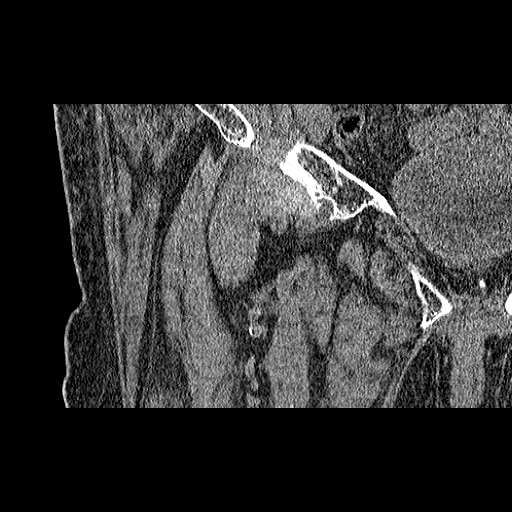
[im 40/80  bone]
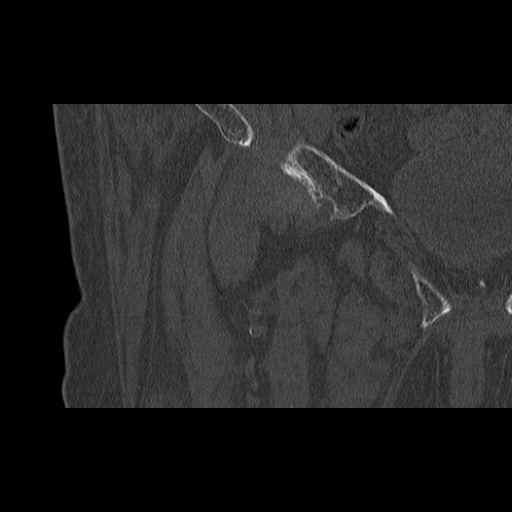
[im 60/80  soft-tissue]
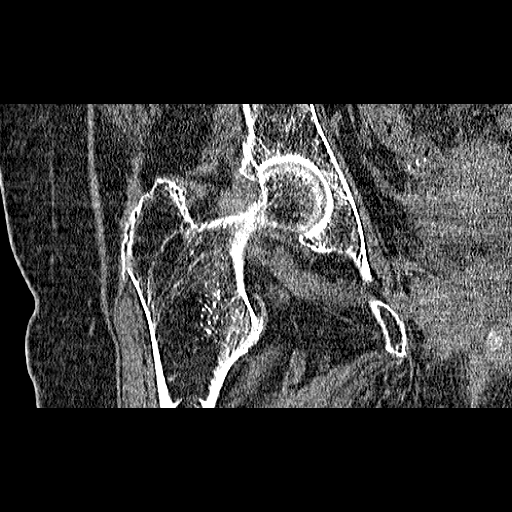

[16 of 46 positions shown; findings below may reference images not displayed]

FINDINGS: The right hip is normally located. Mild to moderate degenerative
changes. No acute fracture. No avascular necrosis. The acetabulum is
intact. The right pubic rami appear normal. The pubic symphysis
demonstrates mild degenerative changes.
IMPRESSION: No acute hip fracture.
